# Patient Record
Sex: Male | Born: 1945
Health system: Southern US, Community
[De-identification: ages and names within clinical notes are randomized; demographics above are authoritative.]

## PROBLEM LIST (undated history)

## (undated) DIAGNOSIS — E079 Disorder of thyroid, unspecified: Secondary | ICD-10-CM

## (undated) DIAGNOSIS — D696 Thrombocytopenia, unspecified: Secondary | ICD-10-CM

## (undated) DIAGNOSIS — E039 Hypothyroidism, unspecified: Secondary | ICD-10-CM

## (undated) DIAGNOSIS — E785 Hyperlipidemia, unspecified: Secondary | ICD-10-CM

## (undated) DIAGNOSIS — E119 Type 2 diabetes mellitus without complications: Secondary | ICD-10-CM

## (undated) DIAGNOSIS — Z87442 Personal history of urinary calculi: Secondary | ICD-10-CM

## (undated) DIAGNOSIS — F0390 Unspecified dementia without behavioral disturbance: Secondary | ICD-10-CM

## (undated) DIAGNOSIS — I10 Essential (primary) hypertension: Secondary | ICD-10-CM

## (undated) DIAGNOSIS — G4733 Obstructive sleep apnea (adult) (pediatric): Secondary | ICD-10-CM

## (undated) HISTORY — PX: COLONOSCOPY: SHX174

## (undated) HISTORY — PX: BONE MARROW BIOPSY: SHX199

---

## 2004-05-06 ENCOUNTER — Ambulatory Visit: Payer: Self-pay | Admitting: Internal Medicine

## 2004-06-26 ENCOUNTER — Ambulatory Visit: Payer: Self-pay | Admitting: Internal Medicine

## 2005-07-05 ENCOUNTER — Ambulatory Visit: Payer: Self-pay | Admitting: Internal Medicine

## 2007-04-03 ENCOUNTER — Ambulatory Visit: Payer: Self-pay | Admitting: Internal Medicine

## 2007-06-04 ENCOUNTER — Ambulatory Visit: Payer: Self-pay | Admitting: Gastroenterology

## 2007-07-07 ENCOUNTER — Ambulatory Visit: Payer: Self-pay | Admitting: Internal Medicine

## 2007-07-10 ENCOUNTER — Ambulatory Visit: Payer: Self-pay | Admitting: Internal Medicine

## 2007-08-07 ENCOUNTER — Ambulatory Visit: Payer: Self-pay | Admitting: Internal Medicine

## 2007-09-07 ENCOUNTER — Ambulatory Visit: Payer: Self-pay | Admitting: Internal Medicine

## 2007-12-05 ENCOUNTER — Ambulatory Visit: Payer: Self-pay | Admitting: Internal Medicine

## 2008-01-05 ENCOUNTER — Ambulatory Visit: Payer: Self-pay | Admitting: Internal Medicine

## 2008-07-06 ENCOUNTER — Ambulatory Visit: Payer: Self-pay | Admitting: Internal Medicine

## 2008-08-06 ENCOUNTER — Ambulatory Visit: Payer: Self-pay | Admitting: Internal Medicine

## 2013-02-18 ENCOUNTER — Ambulatory Visit: Payer: Self-pay | Admitting: Gastroenterology

## 2018-02-20 ENCOUNTER — Other Ambulatory Visit: Payer: Self-pay

## 2018-02-20 ENCOUNTER — Emergency Department: Payer: Medicare PPO

## 2018-02-20 ENCOUNTER — Encounter: Payer: Self-pay | Admitting: Emergency Medicine

## 2018-02-20 ENCOUNTER — Emergency Department
Admission: EM | Admit: 2018-02-20 | Discharge: 2018-02-20 | Disposition: A | Discharge status: Home / Self Care | Payer: Medicare PPO | Attending: Emergency Medicine | Admitting: Emergency Medicine

## 2018-02-20 DIAGNOSIS — Y929 Unspecified place or not applicable: Secondary | ICD-10-CM | POA: Diagnosis not present

## 2018-02-20 DIAGNOSIS — S2241XA Multiple fractures of ribs, right side, initial encounter for closed fracture: Secondary | ICD-10-CM | POA: Insufficient documentation

## 2018-02-20 DIAGNOSIS — S299XXA Unspecified injury of thorax, initial encounter: Secondary | ICD-10-CM | POA: Diagnosis present

## 2018-02-20 DIAGNOSIS — E119 Type 2 diabetes mellitus without complications: Secondary | ICD-10-CM | POA: Diagnosis not present

## 2018-02-20 DIAGNOSIS — E039 Hypothyroidism, unspecified: Secondary | ICD-10-CM | POA: Diagnosis not present

## 2018-02-20 DIAGNOSIS — I10 Essential (primary) hypertension: Secondary | ICD-10-CM | POA: Diagnosis not present

## 2018-02-20 DIAGNOSIS — Y9389 Activity, other specified: Secondary | ICD-10-CM | POA: Diagnosis not present

## 2018-02-20 DIAGNOSIS — Y998 Other external cause status: Secondary | ICD-10-CM | POA: Insufficient documentation

## 2018-02-20 DIAGNOSIS — W108XXA Fall (on) (from) other stairs and steps, initial encounter: Secondary | ICD-10-CM | POA: Diagnosis not present

## 2018-02-20 HISTORY — DX: Type 2 diabetes mellitus without complications: E11.9

## 2018-02-20 HISTORY — DX: Thrombocytopenia, unspecified: D69.6

## 2018-02-20 HISTORY — DX: Disorder of thyroid, unspecified: E07.9

## 2018-02-20 HISTORY — DX: Obstructive sleep apnea (adult) (pediatric): G47.33

## 2018-02-20 HISTORY — DX: Hyperlipidemia, unspecified: E78.5

## 2018-02-20 HISTORY — DX: Essential (primary) hypertension: I10

## 2018-02-20 MED ORDER — OXYCODONE-ACETAMINOPHEN 5-325 MG PO TABS: 1.0000 | ORAL_TABLET | ORAL | 0 refills | Status: DC | PRN

## 2018-02-20 MED ORDER — BUPIVACAINE HCL (PF) 0.5 % IJ SOLN
INTRAMUSCULAR | Status: AC
  Administered 2018-02-20: 50 mL
  Filled 2018-02-20: qty 30

## 2018-02-20 MED ORDER — BUPIVACAINE HCL 0.5 % IJ SOLN
50.0000 mL | Freq: Once | INTRAMUSCULAR | Status: AC
  Administered 2018-02-20: 50 mL
  Filled 2018-02-20: qty 50

## 2018-02-20 MED ORDER — ONDANSETRON HCL 4 MG/2ML IJ SOLN
4.0000 mg | Freq: Once | INTRAMUSCULAR | Status: AC
  Administered 2018-02-20: 4 mg via INTRAVENOUS
  Filled 2018-02-20: qty 2

## 2018-02-20 MED ORDER — LIDOCAINE HCL (PF) 1 % IJ SOLN
5.0000 mL | Freq: Once | INTRAMUSCULAR | Status: AC
  Administered 2018-02-20: 5 mL
  Filled 2018-02-20: qty 5

## 2018-02-20 MED ORDER — MORPHINE SULFATE (PF) 4 MG/ML IV SOLN
4.0000 mg | Freq: Once | INTRAVENOUS | Status: AC
  Administered 2018-02-20: 4 mg via INTRAVENOUS
  Filled 2018-02-20: qty 1

## 2018-02-20 MED ORDER — OXYCODONE-ACETAMINOPHEN 5-325 MG PO TABS
2.0000 | ORAL_TABLET | Freq: Once | ORAL | Status: AC
  Administered 2018-02-20: 2 via ORAL
  Filled 2018-02-20: qty 2

## 2018-02-20 NOTE — ED Triage Notes (Signed)
Patient to ER for c/o pain to right sided ribs. Patient states on Sunday night he slipped on some towels that were on the stairs, rib cage on right side hit stair railing. States on Tuesday, area became a little more painful. Reports tonight "It feels like lightning going through it.".

## 2018-02-20 NOTE — ED Provider Notes (Addendum)
[facility] Emergency Department Provider Note ___________________________   First MD Initiated Contact with Patient 02/20/18 [phone]     (approximate)  I have reviewed the triage vital signs and the nursing notes.   HISTORY  Chief Complaint Rib Injury    HPI Tanner Oconnell is a 72 y.o. male with below list of chronic medical conditions presents to the emergency department with history of accidental slip and fall with resultant right lateral chest wall injury on Sunday night.  Patient admits to immediate pain in the areA.  Patient states tonight pain abruptly worsened when he rolled over in bed.  Patient states that the pain is worse with movement and deep inspiration.  Patient denies any fever no cough.  Past Medical History:  Diagnosis Date  . Diabetes mellitus without complication ([place])   . Hyperlipidemia   . Hypertension   . Obstructive sleep apnea   . Thrombocytopenia ([place])   . Thyroid disease    Hypothyroidism    There are no active problems to display for this patient.   Past Surgical History:  Procedure Laterality Date  . BONE MARROW BIOPSY      Prior to Admission medications   Medication Sig Start Date End Date Taking? Authorizing Provider  oxyCODONE-acetaminophen (PERCOCET) 5-325 MG tablet Take 1 tablet by mouth every 4 (four) hours as needed for severe pain. 02/20/18   Gregor Hams, MD    Allergies Penicillins  No family history on file.  Social History Social History   Tobacco Use  . Smoking status: Never Smoker  . Smokeless tobacco: Current User    Types: Chew  Substance Use Topics  . Alcohol use: Never    Frequency: Never  . Drug use: Never    Review of Systems Constitutional: No fever/chills Eyes: No visual changes. ENT: No sore throat. Cardiovascular: Positive for right lateral chest wall pain Respiratory: Denies shortness of breath. Gastrointestinal: No abdominal pain.  No nausea, no vomiting.  No  diarrhea.  No constipation. Genitourinary: Negative for dysuria. Musculoskeletal: Negative for neck pain.  Negative for back pain. Integumentary: Negative for rash. Neurological: Negative for headaches, focal weakness or numbness.   ____________________________________________   PHYSICAL EXAM:  VITAL SIGNS: ED Triage Vitals  Enc Vitals Group     BP 02/20/18 0304 (!) 159/73     Pulse Rate 02/20/18 0304 (!) 57     Resp 02/20/18 0304 20     Temp 02/20/18 0304 98.3 F (36.8 C)     Temp Source 02/20/18 0304 Oral     SpO2 02/20/18 0304 99 %     Weight 02/20/18 0305 115.7 kg (255 lb)     Height 02/20/18 0305 1.829 m (6')     Head Circumference --      Peak Flow --      Pain Score 02/20/18 0305 1     Pain Loc --      Pain Edu? --      Excl. in [place]? --     Constitutional: Alert and oriented.  Apparent discomfort  eyes: Conjunctivae are normal. Head: Atraumatic. Mouth/Throat: Mucous membranes are moist.  Oropharynx non-erythematous. Neck: No stridor.   Cardiovascular: Normal rate, regular rhythm. Good peripheral circulation. Grossly normal heart sounds. Chest: Pain to palpation of the right lateral chest wall area fourth fifth and sixth rib Respiratory: Normal respiratory effort.  No retractions. Lungs CTAB. Gastrointestinal: Soft and nontender. No distention.  Musculoskeletal: No lower extremity tenderness nor edema. No gross deformities  of extremities. Neurologic:  Normal speech and language. No gross focal neurologic deficits are appreciated.  Skin:  Skin is warm, dry and intact. No rash noted. Psychiatric: Mood and affect are normal. Speech and behavior are normal.   RADIOLOGY I, [place], personally viewed and evaluated these images (plain radiographs) as part of my medical decision making, as well as reviewing the written report by the radiologist.  ED MD interpretation: Fracture of the right fifth and sixth rib noted on chest x-ray.  Official radiology  report(s): Dg Ribs Unilateral W/chest Right  Result Date: 02/20/2018 CLINICAL DATA:  72 year old male with right-sided chest pain. EXAM: RIGHT RIBS AND CHEST - 3+ VIEW COMPARISON:  Chest CT dated 04/03/2007 FINDINGS: Minimal bibasilar atelectatic changes. No focal consolidation, pleural effusion, or pneumothorax. Mild cardiomegaly. Fractures of the lateral right fifth and sixth ribs. Degenerative changes of the spine. IMPRESSION: Right-sided rib fractures.  No pneumothorax. Electronically Signed   By: Anner Crete M.D.   On: 02/20/2018 03:44     Procedures   ____________________________________________   INITIAL IMPRESSION / ASSESSMENT AND PLAN / ED COURSE  As part of my medical decision making, I reviewed the following data within the electronic MEDICAL RECORD NUMBER   72 year old male presenting with history and physical exam consistent with right rib fracture which was confirmed on rib x-ray.  No evidence of pneumothorax or pulmonary contusion.  Patient given IV morphine in the emergency department with pain improvement.  Spoke with the patient and his wife about management of rib fractures and the necessity of controlling his pain.  Both the patient and his wife were advised of warning signs that would warrant immediate return to the emergency department.  Patient will be prescribed Percocet for home ____________________________________________  FINAL CLINICAL IMPRESSION(S) / ED DIAGNOSES  Final diagnoses:  Closed fracture of multiple ribs of right side, initial encounter     MEDICATIONS GIVEN DURING THIS VISIT:  Medications  oxyCODONE-acetaminophen (PERCOCET/ROXICET) 5-325 MG per tablet 2 tablet (has no administration in time range)  ondansetron (ZOFRAN) injection 4 mg (4 mg Intravenous Given 02/20/18 0425)  morphine 4 MG/ML injection 4 mg (4 mg Intravenous Given 02/20/18 0425)  bupivacaine (MARCAINE) 0.5 % (with pres) injection 50 mL (50 mLs Infiltration Given 02/20/18 0527)    lidocaine (PF) (XYLOCAINE) 1 % injection 5 mL (5 mLs Other Given 02/20/18 9528)     ED Discharge Orders        Ordered    oxyCODONE-acetaminophen (PERCOCET) 5-325 MG tablet  Every 4 hours PRN     02/20/18 0525       Note:  This document was prepared using Dragon voice recognition software and may include unintentional dictation errors.    Gregor Hams, MD 02/20/18 0542    Gregor Hams, MD 02/20/18 [phone]

## 2018-03-27 ENCOUNTER — Ambulatory Visit
Admission: EM | Admit: 2018-03-27 | Discharge: 2018-03-27 | Discharge status: Against Medical Advice | Payer: Medicare PPO | Source: Home / Self Care | Attending: Family Medicine | Admitting: Family Medicine

## 2018-03-27 ENCOUNTER — Encounter: Payer: Self-pay | Admitting: Emergency Medicine

## 2018-03-27 ENCOUNTER — Emergency Department
Admission: EM | Admit: 2018-03-27 | Discharge: 2018-03-27 | Disposition: A | Discharge status: Home / Self Care | Payer: Medicare PPO | Attending: Emergency Medicine | Admitting: Emergency Medicine

## 2018-03-27 ENCOUNTER — Other Ambulatory Visit: Payer: Self-pay

## 2018-03-27 DIAGNOSIS — K0889 Other specified disorders of teeth and supporting structures: Secondary | ICD-10-CM | POA: Diagnosis present

## 2018-03-27 DIAGNOSIS — E119 Type 2 diabetes mellitus without complications: Secondary | ICD-10-CM | POA: Diagnosis not present

## 2018-03-27 DIAGNOSIS — F17220 Nicotine dependence, chewing tobacco, uncomplicated: Secondary | ICD-10-CM | POA: Diagnosis not present

## 2018-03-27 DIAGNOSIS — Z79899 Other long term (current) drug therapy: Secondary | ICD-10-CM | POA: Insufficient documentation

## 2018-03-27 DIAGNOSIS — I10 Essential (primary) hypertension: Secondary | ICD-10-CM | POA: Diagnosis not present

## 2018-03-27 DIAGNOSIS — E039 Hypothyroidism, unspecified: Secondary | ICD-10-CM | POA: Diagnosis not present

## 2018-03-27 DIAGNOSIS — K047 Periapical abscess without sinus: Secondary | ICD-10-CM | POA: Insufficient documentation

## 2018-03-27 LAB — BASIC METABOLIC PANEL
ANION GAP: 7 (ref 5–15)
BUN: 20 mg/dL (ref 8–23)
CALCIUM: 9.8 mg/dL (ref 8.9–10.3)
CO2: 30 mmol/L (ref 22–32)
CREATININE: 1 mg/dL (ref 0.61–1.24)
Chloride: 101 mmol/L (ref 98–111)
GFR calc non Af Amer: >60 mL/min (ref >60)
Glucose, Bld: 140 mg/dL — ABNORMAL HIGH (ref 70–99)
Potassium: 4.2 mmol/L (ref 3.5–5.1)
Sodium: 138 mmol/L (ref 135–145)

## 2018-03-27 LAB — CBC
HEMATOCRIT: 41.3 % (ref 40.0–52.0)
HEMOGLOBIN: 14.6 g/dL (ref 13.0–18.0)
MCH: 30.7 pg (ref 26.0–34.0)
MCHC: 35.3 g/dL (ref 32.0–36.0)
MCV: 86.9 fL (ref 80.0–100.0)
Platelets: 112 10*3/uL — ABNORMAL LOW (ref 150–440)
RBC: 4.76 MIL/uL (ref 4.40–5.90)
RDW: 14.4 % (ref 11.5–14.5)
WBC: 8 10*3/uL (ref 3.8–10.6)

## 2018-03-27 MED ORDER — OXYCODONE-ACETAMINOPHEN 5-325 MG PO TABS
1.0000 | ORAL_TABLET | Freq: Once | ORAL | Status: AC
  Administered 2018-03-27: 1 via ORAL
  Filled 2018-03-27: qty 1

## 2018-03-27 MED ORDER — CLINDAMYCIN PHOSPHATE 600 MG/50ML IV SOLN
600.0000 mg | Freq: Once | INTRAVENOUS | Status: AC
  Administered 2018-03-27: 600 mg via INTRAVENOUS
  Filled 2018-03-27: qty 50

## 2018-03-27 MED ORDER — LEVOFLOXACIN IN D5W 750 MG/150ML IV SOLN
750.0000 mg | Freq: Once | INTRAVENOUS | Status: AC
  Administered 2018-03-27: 750 mg via INTRAVENOUS
  Filled 2018-03-27: qty 150

## 2018-03-27 MED ORDER — LIDOCAINE VISCOUS HCL 2 % MT SOLN
15.0000 mL | Freq: Once | OROMUCOSAL | Status: AC
  Administered 2018-03-27: 15 mL via OROMUCOSAL
  Filled 2018-03-27: qty 15

## 2018-03-27 MED ORDER — LIDOCAINE VISCOUS HCL 2 % MT SOLN: 10.0000 mL | OROMUCOSAL | 0 refills | Status: DC | PRN

## 2018-03-27 MED ORDER — ACETAMINOPHEN 325 MG PO TABS
650.0000 mg | ORAL_TABLET | Freq: Once | ORAL | Status: AC
  Administered 2018-03-27: 650 mg via ORAL

## 2018-03-27 MED ORDER — ACETAMINOPHEN 325 MG PO TABS
ORAL_TABLET | ORAL | Status: AC
  Administered 2018-03-27: 650 mg via ORAL
  Filled 2018-03-27: qty 2

## 2018-03-27 MED ORDER — CLINDAMYCIN HCL 300 MG PO CAPS: 300.0000 mg | ORAL_CAPSULE | Freq: Three times a day (TID) | ORAL | 0 refills | Status: AC

## 2018-03-27 MED ORDER — SODIUM CHLORIDE 0.9 % IV BOLUS: 1000.0000 mL | Freq: Once | INTRAVENOUS | Status: DC

## 2018-03-27 NOTE — ED Notes (Signed)
Pt observed to have swelling to left jaw , (reported to have started last night ) Pt reports he has DDS appointment tomorrow ,was at urgent care today and sent here for pain management and IV antibiotics Pt requesting pain meds at this time .

## 2018-03-27 NOTE — ED Provider Notes (Signed)
[facility] Emergency Department Provider Note  ____________________________________________  Time seen: Approximately 8:52 PM  I have reviewed the triage vital signs and the nursing notes.   HISTORY  Chief Complaint Dental Pain    HPI Tanner Oconnell is a 72 y.o. male presents emergency department for evaluation of dental pain and left cheek swelling for 1 day.  He is having pain over his top left back molar.  Patient states that he went to urgent care but while he was waiting for urgent care he talked to his friend that is a Pharmacist, community and was told that he should come to the emergency department for IV antibiotics.  He has an appointment with an oral surgeon tomorrow at noon.  He has felt warm but has not checked his temperature.  Patient is allergic to penicillin and has not had it as a child and is not sure what his reaction was.  He is not having any difficulty breathing or swallowing.  Past Medical History:  Diagnosis Date  . Diabetes mellitus without complication ([place])   . Hyperlipidemia   . Hypertension   . Obstructive sleep apnea   . Thrombocytopenia ([place])   . Thyroid disease    Hypothyroidism    There are no active problems to display for this patient.   Past Surgical History:  Procedure Laterality Date  . BONE MARROW BIOPSY      Prior to Admission medications   Medication Sig Start Date End Date Taking? Authorizing Provider  clindamycin (CLEOCIN) 300 MG capsule Take 1 capsule (300 mg total) by mouth 3 (three) times daily for 10 days. 03/27/18 04/06/18  Laban Emperor, PA-C  lidocaine (XYLOCAINE) 2 % solution Use as directed 10 mLs in the mouth or throat as needed for mouth pain. 03/27/18   Laban Emperor, PA-C  oxyCODONE-acetaminophen (PERCOCET) 5-325 MG tablet Take 1 tablet by mouth every 4 (four) hours as needed for severe pain. 02/20/18   Gregor Hams, MD    Allergies Penicillins  No family history on file.  Social History Social  History   Tobacco Use  . Smoking status: Never Smoker  . Smokeless tobacco: Current User    Types: Chew  Substance Use Topics  . Alcohol use: Never    Frequency: Never  . Drug use: Never     Review of Systems  Constitutional: Positive for feeling warm. Respiratory:  No SOB. Gastrointestinal: No nausea, no vomiting.  Musculoskeletal: Negative for musculoskeletal pain. Skin: Negative for rash, abrasions, lacerations, ecchymosis.   ____________________________________________   PHYSICAL EXAM:  VITAL SIGNS: ED Triage Vitals [03/27/18 1817]  Enc Vitals Group     BP (!) 173/84     Pulse Rate 91     Resp 16     Temp 99.5 F (37.5 C)     Temp Source Oral     SpO2 96 %     Weight 255 lb (115.7 kg)     Height 6' (1.829 m)     Head Circumference      Peak Flow      Pain Score 9     Pain Loc      Pain Edu?      Excl. in [place]?      Constitutional: Alert and oriented. Well appearing and in no acute distress. Eyes: Conjunctivae are normal. PERRL. EOMI. Head: Atraumatic. ENT:      Ears:      Nose: No congestion/rhinnorhea.      Mouth/Throat: Mucous membranes are  moist.  Swelling to left cheek.  Tenderness to palpation over tooth #15.  Minimal yellow purulent drainage lateral to back molar.  No submandibular swelling. No trismus.  Neck: No stridor.   Cardiovascular: Normal rate, regular rhythm.  Good peripheral circulation. Respiratory: Normal respiratory effort without tachypnea or retractions. Lungs CTAB. Good air entry to the bases with no decreased or absent breath sounds. Musculoskeletal: Full range of motion to all extremities. No gross deformities appreciated. Neurologic:  Normal speech and language. No gross focal neurologic deficits are appreciated.  Skin:  Skin is warm, dry and intact. No rash noted. Psychiatric: Mood and affect are normal. Speech and behavior are normal. Patient exhibits appropriate insight and  judgement.   ____________________________________________   LABS (all labs ordered are listed, but only abnormal results are displayed)  Labs Reviewed  CBC - Abnormal; Notable for the following components:      Result Value   Platelets 112 (*)    All other components within normal limits  BASIC METABOLIC PANEL - Abnormal; Notable for the following components:   Glucose, Bld 140 (*)    All other components within normal limits   ____________________________________________  EKG   ____________________________________________  RADIOLOGY   No results found.  ____________________________________________    PROCEDURES  Procedure(s) performed:    Procedures    Medications  sodium chloride 0.9 % bolus 1,000 mL (has no administration in time range)  clindamycin (CLEOCIN) IVPB 600 mg (0 mg Intravenous Stopped 03/27/18 2117)  levofloxacin (LEVAQUIN) IVPB 750 mg (0 mg Intravenous Stopped 03/27/18 2219)  oxyCODONE-acetaminophen (PERCOCET/ROXICET) 5-325 MG per tablet 1 tablet (1 tablet Oral Given 03/27/18 2042)  lidocaine (XYLOCAINE) 2 % viscous mouth solution 15 mL (15 mLs Mouth/Throat Given 03/27/18 2042)  acetaminophen (TYLENOL) tablet 650 mg (650 mg Oral Given 03/27/18 2235)     ____________________________________________   INITIAL IMPRESSION / ASSESSMENT AND PLAN / ED COURSE  Pertinent labs & imaging results that were available during my care of the patient were reviewed by me and considered in my medical decision making (see chart for details).  Review of the [place] CSRS was performed in accordance of the [place] prior to dispensing any controlled drugs.   Patient's diagnosis is consistent with dental abscess.  Vital signs and exam are reassuring.  WBC within normal limits.   Patient appears well and is very talkative.  He is not having any difficulty breathing or swallowing. He is drinking cups of ice water. Patient is allergic to amoxicillin and penicillin.  He was given  IV clindamycin and Levaquin in ED. Pain improved with viscous lidocaine.  Patient will be discharged home with prescriptions for clindamycin.  He feels well and is ready to go home.  Patient is to follow up with dentist as directed.  He has an appointment with oral surgery tomorrow at noon.  Patient is given ED precautions to return to the ED for any worsening or new symptoms.     ____________________________________________  FINAL CLINICAL IMPRESSION(S) / ED DIAGNOSES  Final diagnoses:  Dental abscess      NEW MEDICATIONS STARTED DURING THIS VISIT:  ED Discharge Orders         Ordered    clindamycin (CLEOCIN) 300 MG capsule  3 times daily     03/27/18 2126    lidocaine (XYLOCAINE) 2 % solution  As needed     03/27/18 2126              This chart was dictated using voice recognition  software/Dragon. Despite best efforts to proofread, errors can occur which can change the meaning. Any change was purely unintentional.    Laban Emperor, PA-C 03/27/18 2357    Arta Silence, MD 04/13/18 [phone]

## 2018-03-27 NOTE — ED Provider Notes (Signed)
Patient left before being seen. Went to ED.  [facility]    [place], [place] 03/27/18 1756

## 2018-03-27 NOTE — Discharge Instructions (Addendum)
Follow up with your oral surgery appointment tomorrow.

## 2018-03-27 NOTE — ED Triage Notes (Signed)
Pt to ED via POV with c/o dental pain, pt states after he ate dinner last night he possible has something stuck in gum, + swelling noted. NAD noted

## 2018-09-29 ENCOUNTER — Other Ambulatory Visit: Payer: Self-pay | Admitting: Neurology

## 2018-09-29 DIAGNOSIS — R413 Other amnesia: Secondary | ICD-10-CM

## 2018-10-07 ENCOUNTER — Ambulatory Visit
Admission: RE | Admit: 2018-10-07 | Discharge: 2018-10-07 | Disposition: A | Discharge status: Home / Self Care | Payer: Medicare HMO | Source: Ambulatory Visit | Attending: Neurology | Admitting: Neurology

## 2018-10-07 ENCOUNTER — Other Ambulatory Visit: Payer: Self-pay

## 2018-10-07 DIAGNOSIS — R413 Other amnesia: Secondary | ICD-10-CM

## 2018-11-10 ENCOUNTER — Other Ambulatory Visit (HOSPITAL_COMMUNITY): Payer: Self-pay | Admitting: Gastroenterology

## 2018-11-10 ENCOUNTER — Other Ambulatory Visit: Payer: Self-pay | Admitting: Gastroenterology

## 2018-11-10 DIAGNOSIS — R17 Unspecified jaundice: Secondary | ICD-10-CM

## 2018-11-10 DIAGNOSIS — D696 Thrombocytopenia, unspecified: Secondary | ICD-10-CM

## 2018-11-24 ENCOUNTER — Other Ambulatory Visit: Payer: Self-pay

## 2018-11-24 ENCOUNTER — Ambulatory Visit
Admission: RE | Admit: 2018-11-24 | Discharge: 2018-11-24 | Disposition: A | Discharge status: Home / Self Care | Payer: Medicare HMO | Source: Ambulatory Visit | Attending: Gastroenterology | Admitting: Gastroenterology

## 2018-11-24 DIAGNOSIS — D696 Thrombocytopenia, unspecified: Secondary | ICD-10-CM | POA: Diagnosis not present

## 2018-11-24 DIAGNOSIS — R17 Unspecified jaundice: Secondary | ICD-10-CM

## 2019-01-16 ENCOUNTER — Ambulatory Visit
Admission: RE | Admit: 2019-01-16 | Discharge: 2019-01-16 | Disposition: A | Discharge status: Home / Self Care | Payer: Medicare HMO | Attending: Internal Medicine | Admitting: Internal Medicine

## 2019-01-16 ENCOUNTER — Other Ambulatory Visit: Payer: Self-pay | Admitting: Internal Medicine

## 2019-01-16 ENCOUNTER — Other Ambulatory Visit: Payer: Self-pay

## 2019-01-16 ENCOUNTER — Ambulatory Visit
Admission: RE | Admit: 2019-01-16 | Discharge: 2019-01-16 | Disposition: A | Discharge status: Home / Self Care | Payer: Medicare HMO | Source: Ambulatory Visit | Attending: Internal Medicine | Admitting: Internal Medicine

## 2019-01-16 DIAGNOSIS — M545 Low back pain, unspecified: Secondary | ICD-10-CM

## 2019-02-04 ENCOUNTER — Other Ambulatory Visit: Payer: Self-pay | Admitting: Internal Medicine

## 2019-02-04 DIAGNOSIS — M545 Low back pain, unspecified: Secondary | ICD-10-CM

## 2019-02-18 ENCOUNTER — Ambulatory Visit: Payer: Medicare HMO

## 2019-03-08 ENCOUNTER — Ambulatory Visit: Payer: Medicare HMO

## 2019-10-06 ENCOUNTER — Other Ambulatory Visit: Payer: Self-pay

## 2019-10-06 ENCOUNTER — Ambulatory Visit (INDEPENDENT_AMBULATORY_CARE_PROVIDER_SITE_OTHER)
Admit: 2019-10-06 | Discharge: 2019-10-06 | Disposition: A | Discharge status: Home / Self Care | Payer: Medicare HMO | Attending: Family Medicine | Admitting: Family Medicine

## 2019-10-06 ENCOUNTER — Ambulatory Visit
Admission: EM | Admit: 2019-10-06 | Discharge: 2019-10-06 | Disposition: A | Discharge status: Home / Self Care | Payer: Medicare HMO | Attending: Family Medicine | Admitting: Family Medicine

## 2019-10-06 ENCOUNTER — Encounter: Payer: Self-pay | Admitting: Emergency Medicine

## 2019-10-06 DIAGNOSIS — N1330 Unspecified hydronephrosis: Secondary | ICD-10-CM | POA: Diagnosis present

## 2019-10-06 DIAGNOSIS — R1031 Right lower quadrant pain: Secondary | ICD-10-CM

## 2019-10-06 DIAGNOSIS — M545 Low back pain: Secondary | ICD-10-CM | POA: Diagnosis not present

## 2019-10-06 DIAGNOSIS — N201 Calculus of ureter: Secondary | ICD-10-CM | POA: Insufficient documentation

## 2019-10-06 HISTORY — DX: Unspecified dementia, unspecified severity, without behavioral disturbance, psychotic disturbance, mood disturbance, and anxiety: F03.90

## 2019-10-06 LAB — COMPREHENSIVE METABOLIC PANEL
ALT: 18 U/L (ref 0–44)
AST: 26 U/L (ref 15–41)
Albumin: 4.9 g/dL (ref 3.5–5.0)
Alkaline Phosphatase: 46 U/L (ref 38–126)
Anion gap: 11 (ref 5–15)
BUN: 30 mg/dL — ABNORMAL HIGH (ref 8–23)
CO2: 21 mmol/L — ABNORMAL LOW (ref 22–32)
Calcium: 9.5 mg/dL (ref 8.9–10.3)
Chloride: 100 mmol/L (ref 98–111)
Creatinine, Ser: 1.87 mg/dL — ABNORMAL HIGH (ref 0.61–1.24)
GFR calc Af Amer: 40 mL/min — ABNORMAL LOW (ref >60)
GFR calc non Af Amer: 34 mL/min — ABNORMAL LOW (ref >60)
Glucose, Bld: 141 mg/dL — ABNORMAL HIGH (ref 70–99)
Potassium: 3.9 mmol/L (ref 3.5–5.1)
Sodium: 132 mmol/L — ABNORMAL LOW (ref 135–145)
Total Bilirubin: 1.5 mg/dL — ABNORMAL HIGH (ref 0.3–1.2)
Total Protein: 8.7 g/dL — ABNORMAL HIGH (ref 6.5–8.1)

## 2019-10-06 LAB — CBC WITH DIFFERENTIAL/PLATELET
Abs Immature Granulocytes: 0.03 10*3/uL (ref 0.00–0.07)
Basophils Absolute: 0 10*3/uL (ref 0.0–0.1)
Basophils Relative: 0 %
Eosinophils Absolute: 0.1 10*3/uL (ref 0.0–0.5)
Eosinophils Relative: 1 %
HCT: 36.5 % — ABNORMAL LOW (ref 39.0–52.0)
Hemoglobin: 12.4 g/dL — ABNORMAL LOW (ref 13.0–17.0)
Immature Granulocytes: 0 %
Lymphocytes Relative: 11 %
Lymphs Abs: 0.8 10*3/uL (ref 0.7–4.0)
MCH: 28.9 pg (ref 26.0–34.0)
MCHC: 34 g/dL (ref 30.0–36.0)
MCV: 85.1 fL (ref 80.0–100.0)
Monocytes Absolute: 0.7 10*3/uL (ref 0.1–1.0)
Monocytes Relative: 10 %
Neutro Abs: 5.3 10*3/uL (ref 1.7–7.7)
Neutrophils Relative %: 78 %
Platelets: 139 10*3/uL — ABNORMAL LOW (ref 150–400)
RBC: 4.29 MIL/uL (ref 4.22–5.81)
RDW: 14 % (ref 11.5–15.5)
WBC: 6.9 10*3/uL (ref 4.0–10.5)
nRBC: 0 % (ref 0.0–0.2)

## 2019-10-06 LAB — LIPASE, BLOOD: Lipase: 21 U/L (ref 11–51)

## 2019-10-06 MED ORDER — HYDROCODONE-ACETAMINOPHEN 5-325 MG PO TABS: 1.0000 | ORAL_TABLET | Freq: Three times a day (TID) | ORAL | 0 refills | Status: DC | PRN

## 2019-10-06 MED ORDER — TAMSULOSIN HCL 0.4 MG PO CAPS: 0.4000 mg | ORAL_CAPSULE | Freq: Every day | ORAL | 0 refills | Status: DC

## 2019-10-06 MED ORDER — DOCUSATE SODIUM 100 MG PO CAPS: 100.0000 mg | ORAL_CAPSULE | Freq: Two times a day (BID) | ORAL | 0 refills | Status: DC

## 2019-10-06 NOTE — ED Triage Notes (Signed)
Patient in today with his wife who states patient has dementia. Wife states patient has been c/o abdominal pain (right sided) and right sided back pain x 1 day. Patient states he feels like his abdomen is swelling and feels like he needs to empty it. Not eating. Patient took milk of magnesia on 10/03/19 and again on 10/05/19. Patient has a bowel movement yesterday that was runny and not formed.

## 2019-10-06 NOTE — Discharge Instructions (Signed)
Hold his metformin and HCTZ-Losartan for 1-2 days.  Medication as prescribed.  If he worsens, go to the ER.  Take care  Dr. Lacinda Axon

## 2019-10-06 NOTE — ED Triage Notes (Signed)
Called [employer] and spoke with Franchot Heidelberg for prior auth of CT abd/pelvis without contrast. CT has been approved (no auth# given).

## 2019-10-06 NOTE — ED Provider Notes (Addendum)
[facility]    CSN: [id number] Arrival date & time: 10/06/19  1325      History   Chief Complaint Chief Complaint  Patient presents with  . Abdominal Pain  . Back Pain   HPI   74 year old male presents for evaluation of the above.  History obtained primarily from the wife as patient has memory loss which is thought to be secondary to underlying Alzheimer's dementia per neurology.  Wife reports that he has had ongoing abdominal pain for the past 2 days.  Worse last night.  Was severe last night.  She wanted to call EMS to have him taken to the hospital and he refused.  Pain is on the right side of the abdomen/flank.  Patient states that he feels like he is bloated and full in the upper abdomen.  He has not eaten today.  He has taken milk of magnesia without resolution.  He had a bowel movement yesterday.  Denies urinary symptoms.  Abdominal pain 8/10 in severity on triage.  No relieving factors.  Wife reports that he has been complaining of right-sided back pain as well.  No other associated symptoms.  No other complaints or concerns at this time.  Past Medical History:  Diagnosis Date  . Dementia ([place])   . Diabetes mellitus without complication ([place])   . Hyperlipidemia   . Hypertension   . Obstructive sleep apnea   . Thrombocytopenia ([place])   . Thyroid disease    Hypothyroidism   Surgical Hx: COLONOSCOPY 02/18/2013  Dr. Mamie Nick. Oh @ [place] - Diverticulosis, [place] BONE MARROW 08/07/2003 - 08/05/2004  okay   COLONOSCOPY 08/06/2002 - 08/06/2003  Adenomatous Polyps   COLONOSCOPY 06/04/2007  Dr. Ivor Messier @ [facility]    Prior to Admission medications   Medication Sig Start Date End Date Taking? Authorizing Provider  amLODipine (NORVASC) 5 MG tablet TAKE 1 TABLET ONCE DAILY 06/19/19  Yes [provider]  aspirin 81 MG EC tablet Take by mouth.   Yes [provider]  CALCIUM-VITAMIN D PO Take by mouth.   Yes  [provider]  donepezil (ARICEPT) 10 MG tablet Take 10 mg by mouth daily. 09/01/19  Yes [provider]  fenofibrate (TRICOR) 145 MG tablet Take 1 tablet by mouth daily. 06/25/19 06/24/20 Yes [provider]  ferrous sulfate 325 (65 FE) MG tablet Take by mouth.   Yes [provider]  gabapentin (NEURONTIN) 100 MG capsule Take 1 capsule by mouth 2 (two) times daily.   Yes [provider]  glimepiride (AMARYL) 1 MG tablet Take 1 mg by mouth daily. 09/01/19  Yes [provider]  levothyroxine (SYNTHROID) 25 MCG tablet Take 25 mcg by mouth daily. 09/01/19  Yes [provider]  losartan-hydrochlorothiazide (HYZAAR) 100-25 MG tablet Take 1 tablet by mouth daily. 09/01/19  Yes [provider]  memantine (NAMENDA) 10 MG tablet Take 10 mg by mouth 2 (two) times daily. 09/15/19  Yes [provider]  metFORMIN (GLUCOPHAGE) 1000 MG tablet Take 1,000 mg by mouth 2 (two) times daily. 09/01/19  Yes [provider]  Multiple Vitamin (MULTI-VITAMIN) tablet Take by mouth.   Yes [provider]  Omega-3 Fatty Acids (FISH OIL) 1000 MG CAPS Take by mouth.   Yes [provider]  propranolol (INDERAL) 10 MG tablet Take 10 mg by mouth 2 (two) times daily. 08/31/19  Yes [provider]  sertraline (ZOLOFT) 100 MG tablet  Take 100 mg by mouth daily. 09/07/19  Yes [provider]  vitamin B-12 (CYANOCOBALAMIN) 1000 MCG tablet Take 1,000 mcg by mouth daily.   Yes [provider]  docusate sodium (COLACE) 100 MG capsule Take 1 capsule (100 mg total) by mouth 2 (two) times daily. For constipation. 10/06/19   Coral Spikes, DO  HYDROcodone-acetaminophen (NORCO/VICODIN) 5-325 MG tablet Take 1 tablet by mouth every 8 (eight) hours as needed. 10/06/19   Coral Spikes, DO  tamsulosin (FLOMAX) 0.4 MG CAPS capsule Take 1 capsule (0.4 mg total) by mouth daily. 10/06/19   Coral Spikes, DO    Family History Family  History  Problem Relation Age of Onset  . Cancer Mother   . Cancer Father     Social History Social History   Tobacco Use  . Smoking status: Never Smoker  . Smokeless tobacco: Former Systems developer    Types: Chew  Substance Use Topics  . Alcohol use: Never  . Drug use: Never     Allergies   Penicillins   Review of Systems Review of Systems  Constitutional: Negative.   Gastrointestinal: Positive for abdominal distention and abdominal pain.  Genitourinary: Positive for flank pain.  Musculoskeletal: Positive for back pain.   Physical Exam Triage Vital Signs ED Triage Vitals  Enc Vitals Group     BP 10/06/19 1403 (!) 137/105     Pulse Rate 10/06/19 1403 62     Resp 10/06/19 1403 18     Temp 10/06/19 1403 98.2 F (36.8 C)     Temp Source 10/06/19 1403 Oral     SpO2 10/06/19 1403 97 %     Weight 10/06/19 1403 242 lb (109.8 kg)     Height 10/06/19 1403 6' (1.829 m)     Head Circumference --      Peak Flow --      Pain Score 10/06/19 1402 8     Pain Loc --      Pain Edu? --      Excl. in [place]? --    Updated Vital Signs BP (!) 183/85 (BP Location: Right Arm) Comment: patient did not take HTN meds last night  Pulse 62   Temp 98.2 F (36.8 C) (Oral)   Resp 18   Ht 6' (1.829 m)   Wt 109.8 kg   SpO2 97%   BMI 32.82 kg/m   Visual Acuity Right Eye Distance:   Left Eye Distance:   Bilateral Distance:    Right Eye Near:   Left Eye Near:    Bilateral Near:     Physical Exam Vitals and nursing note reviewed.  Constitutional:      General: He is not in acute distress.    Appearance: Normal appearance. He is not ill-appearing.  HENT:     Head: Normocephalic and atraumatic.  Eyes:     General:        Right eye: No discharge.        Left eye: No discharge.     Conjunctiva/sclera: Conjunctivae normal.  Cardiovascular:     Rate and Rhythm: Normal rate and regular rhythm.     Heart sounds: No murmur.  Pulmonary:     Effort: Pulmonary effort is normal.     Breath  sounds: Normal breath sounds. No wheezing, rhonchi or rales.  Abdominal:     Comments: Abdomen slightly firm.  Right flank tender to palpation. No rebound or guarding.  Skin:    General: Skin is warm.  Findings: No rash.  Neurological:     Mental Status: He is alert.    UC Treatments / Results  Labs (all labs ordered are listed, but only abnormal results are displayed) Labs Reviewed  CBC WITH DIFFERENTIAL/PLATELET - Abnormal; Notable for the following components:      Result Value   Hemoglobin 12.4 (*)    HCT 36.5 (*)    Platelets 139 (*)    All other components within normal limits  COMPREHENSIVE METABOLIC PANEL - Abnormal; Notable for the following components:   Sodium 132 (*)    CO2 21 (*)    Glucose, Bld 141 (*)    BUN 30 (*)    Creatinine, Ser 1.87 (*)    Total Protein 8.7 (*)    Total Bilirubin 1.5 (*)    GFR calc non Af Amer 34 (*)    GFR calc Af Amer 40 (*)    All other components within normal limits  LIPASE, BLOOD    EKG   Radiology CT ABDOMEN PELVIS WO CONTRAST  Result Date: 10/06/2019 CLINICAL DATA:  Right side abdominal right back pain for 1 day. No known injury. EXAM: CT ABDOMEN AND PELVIS WITHOUT CONTRAST TECHNIQUE: Multidetector CT imaging of the abdomen and pelvis was performed following the standard protocol without IV contrast. COMPARISON:  None. FINDINGS: Lower chest: Mild dependent atelectasis is seen. No pleural or pericardial effusion. Calcific aortic and coronary atherosclerosis. Hepatobiliary: Gallbladder is unremarkable. There is fatty infiltration of the liver. Biliary tree appears normal. Pancreas: Unremarkable. No pancreatic ductal dilatation or surrounding inflammatory changes. Spleen: Normal in size. Calcification of the medial capsular surface is likely due to some prior infection or trauma. Adrenals/Urinary Tract: The adrenal glands appear normal. There is mild right hydronephrosis with some stranding about the right kidney and ureter due to  a punctate stone just proximal to the right ureterovesical junction. No other urinary tract stones are identified. A few small bilateral renal cysts are seen. Urinary bladder is decompressed but otherwise unremarkable. Stomach/Bowel: Stomach is within normal limits. Appendix appears normal. No evidence of bowel wall thickening, distention, or inflammatory changes. Vascular/Lymphatic: Aortic atherosclerosis. No enlarged abdominal or pelvic lymph nodes. Reproductive: Mild prostatomegaly. Other: Small fat containing inguinal hernia is noted. The patient also has a very small fat containing umbilical hernia. Musculoskeletal: No fracture or worrisome lesion. Convex left lumbar scoliosis and scattered degenerative change noted. IMPRESSION: Mild right hydronephrosis due to a punctate stone just proximal to the right UVJ. No other acute abnormality. No other urinary tract stones. Fatty infiltration of the liver. Calcific aortic and coronary atherosclerosis. Mild prostatomegaly. Small fat containing umbilical and bilateral inguinal hernias. Electronically Signed   By: Inge Rise M.D.   On: 10/06/2019 15:28    Procedures Procedures (including critical care time)  Medications Ordered in UC Medications - No data to display  Initial Impression / Assessment and Plan / UC Course  I have reviewed the triage vital signs and the nursing notes.  Pertinent labs & imaging results that were available during my care of the patient were reviewed by me and considered in my medical decision making (see chart for details).    74 year old male presents with flank pain and abdominal pain.  Laboratory studies notable for elevated creatinine of 1.87.  This is higher than his baseline of approximately 1.1.  No leukocytosis.  CT abdomen and pelvis was obtained.  I independently reviewed the CT scan.  Interpretation: Small stone noted at the right UVJ.   Treating  with Flomax, pain medication.  Push fluids.  Strain urine.   Stool softener to prevent constipation.  Supportive care.  [place] controlled substance database reviewed.  No concerns for abuse or misuse.  Final Clinical Impressions(s) / UC Diagnoses   Final diagnoses:  Ureterolithiasis  Hydronephrosis, unspecified hydronephrosis type     Discharge Instructions     Hold his metformin and HCTZ-Losartan for 1-2 days.  Medication as prescribed.  If he worsens, go to the ER.  Take care  Dr. Lacinda Axon     ED Prescriptions    Medication Sig Dispense Auth. Provider   HYDROcodone-acetaminophen (NORCO/VICODIN) 5-325 MG tablet Take 1 tablet by mouth every 8 (eight) hours as needed. 10 tablet Ardis Fullwood G, DO   tamsulosin (FLOMAX) 0.4 MG CAPS capsule Take 1 capsule (0.4 mg total) by mouth daily. 14 capsule Lailana Shira G, DO   docusate sodium (COLACE) 100 MG capsule Take 1 capsule (100 mg total) by mouth 2 (two) times daily. For constipation. 30 capsule Thersa Salt G, DO     I have reviewed the PDMP during this encounter.     Coral Spikes, [place] 10/06/19 1753

## 2020-02-22 ENCOUNTER — Other Ambulatory Visit
Admission: RE | Admit: 2020-02-22 | Discharge: 2020-02-22 | Disposition: A | Discharge status: Home / Self Care | Payer: Medicare HMO | Source: Ambulatory Visit | Attending: General Surgery | Admitting: General Surgery

## 2020-02-22 ENCOUNTER — Other Ambulatory Visit: Payer: Self-pay

## 2020-02-22 DIAGNOSIS — Z20822 Contact with and (suspected) exposure to covid-19: Secondary | ICD-10-CM | POA: Diagnosis not present

## 2020-02-22 DIAGNOSIS — Z01812 Encounter for preprocedural laboratory examination: Secondary | ICD-10-CM | POA: Diagnosis present

## 2020-02-22 LAB — SARS CORONAVIRUS 2 (TAT 6-24 HRS): SARS Coronavirus 2: NEGATIVE

## 2020-02-23 ENCOUNTER — Encounter: Payer: Self-pay | Admitting: General Surgery

## 2020-02-24 ENCOUNTER — Ambulatory Visit: Payer: Medicare HMO | Admitting: Registered Nurse

## 2020-02-24 ENCOUNTER — Encounter
Admission: RE | Disposition: A | Discharge status: Home / Self Care | Payer: Self-pay | Source: Home / Self Care | Attending: General Surgery

## 2020-02-24 ENCOUNTER — Ambulatory Visit
Admission: RE | Admit: 2020-02-24 | Discharge: 2020-02-24 | Disposition: A | Discharge status: Home / Self Care | Payer: Medicare HMO | Attending: General Surgery | Admitting: General Surgery

## 2020-02-24 ENCOUNTER — Other Ambulatory Visit: Payer: Self-pay

## 2020-02-24 ENCOUNTER — Encounter: Payer: Self-pay | Admitting: General Surgery

## 2020-02-24 DIAGNOSIS — Z79890 Hormone replacement therapy (postmenopausal): Secondary | ICD-10-CM | POA: Diagnosis not present

## 2020-02-24 DIAGNOSIS — Z86010 Personal history of colonic polyps: Secondary | ICD-10-CM | POA: Diagnosis not present

## 2020-02-24 DIAGNOSIS — E119 Type 2 diabetes mellitus without complications: Secondary | ICD-10-CM | POA: Insufficient documentation

## 2020-02-24 DIAGNOSIS — Z7984 Long term (current) use of oral hypoglycemic drugs: Secondary | ICD-10-CM | POA: Diagnosis not present

## 2020-02-24 DIAGNOSIS — E039 Hypothyroidism, unspecified: Secondary | ICD-10-CM | POA: Insufficient documentation

## 2020-02-24 DIAGNOSIS — K635 Polyp of colon: Secondary | ICD-10-CM | POA: Insufficient documentation

## 2020-02-24 DIAGNOSIS — K2950 Unspecified chronic gastritis without bleeding: Secondary | ICD-10-CM | POA: Diagnosis not present

## 2020-02-24 DIAGNOSIS — D123 Benign neoplasm of transverse colon: Secondary | ICD-10-CM | POA: Insufficient documentation

## 2020-02-24 DIAGNOSIS — F0390 Unspecified dementia without behavioral disturbance: Secondary | ICD-10-CM | POA: Diagnosis not present

## 2020-02-24 DIAGNOSIS — G4733 Obstructive sleep apnea (adult) (pediatric): Secondary | ICD-10-CM | POA: Diagnosis not present

## 2020-02-24 DIAGNOSIS — I8500 Esophageal varices without bleeding: Secondary | ICD-10-CM | POA: Insufficient documentation

## 2020-02-24 DIAGNOSIS — R1110 Vomiting, unspecified: Secondary | ICD-10-CM | POA: Insufficient documentation

## 2020-02-24 DIAGNOSIS — I10 Essential (primary) hypertension: Secondary | ICD-10-CM | POA: Insufficient documentation

## 2020-02-24 DIAGNOSIS — Z79899 Other long term (current) drug therapy: Secondary | ICD-10-CM | POA: Diagnosis not present

## 2020-02-24 DIAGNOSIS — Z1211 Encounter for screening for malignant neoplasm of colon: Secondary | ICD-10-CM | POA: Diagnosis not present

## 2020-02-24 DIAGNOSIS — D124 Benign neoplasm of descending colon: Secondary | ICD-10-CM | POA: Diagnosis not present

## 2020-02-24 DIAGNOSIS — K5730 Diverticulosis of large intestine without perforation or abscess without bleeding: Secondary | ICD-10-CM | POA: Diagnosis not present

## 2020-02-24 DIAGNOSIS — Z7982 Long term (current) use of aspirin: Secondary | ICD-10-CM | POA: Diagnosis not present

## 2020-02-24 HISTORY — PX: COLONOSCOPY WITH PROPOFOL: SHX5780

## 2020-02-24 HISTORY — DX: Hypothyroidism, unspecified: E03.9

## 2020-02-24 HISTORY — PX: ESOPHAGOGASTRODUODENOSCOPY: SHX5428

## 2020-02-24 HISTORY — DX: Personal history of urinary calculi: Z87.442

## 2020-02-24 LAB — GLUCOSE, CAPILLARY: Glucose-Capillary: 108 mg/dL — ABNORMAL HIGH (ref 70–99)

## 2020-02-24 SURGERY — EGD (ESOPHAGOGASTRODUODENOSCOPY)
Anesthesia: General

## 2020-02-24 MED ORDER — PROPOFOL 10 MG/ML IV BOLUS
INTRAVENOUS | Status: DC | PRN
  Administered 2020-02-24: 20 mg via INTRAVENOUS
  Administered 2020-02-24: 80 mg via INTRAVENOUS

## 2020-02-24 MED ORDER — DEXMEDETOMIDINE HCL 200 MCG/2ML IV SOLN
INTRAVENOUS | Status: DC | PRN
  Administered 2020-02-24: 20 ug via INTRAVENOUS

## 2020-02-24 MED ORDER — PROPOFOL 500 MG/50ML IV EMUL
INTRAVENOUS | Status: DC | PRN
  Administered 2020-02-24: 150 ug/kg/min via INTRAVENOUS

## 2020-02-24 MED ORDER — SODIUM CHLORIDE 0.9 % IV SOLN
INTRAVENOUS | Status: DC
  Administered 2020-02-24: 1000 mL via INTRAVENOUS

## 2020-02-24 MED ORDER — LIDOCAINE HCL (CARDIAC) PF 100 MG/5ML IV SOSY
PREFILLED_SYRINGE | INTRAVENOUS | Status: DC | PRN
  Administered 2020-02-24: 100 mg via INTRAVENOUS

## 2020-02-24 MED ORDER — GLYCOPYRROLATE 0.2 MG/ML IJ SOLN
INTRAMUSCULAR | Status: DC | PRN
  Administered 2020-02-24: .2 mg via INTRAVENOUS

## 2020-02-24 NOTE — Op Note (Signed)
[facility] Gastroenterology Patient Name: Tanner Oconnell Procedure Date: 02/24/2020 9:07 AM MRN: [id number] Account #: [id number] Date of Birth: 1945-12-28 Admit Type: Outpatient Age: 74 Room: [facility] ENDO ROOM 1 Gender: Male Note Status: Finalized Procedure:             Upper GI endoscopy Indications:           Suspected esophageal reflux Providers:             Robert Bellow, MD Referring MD:          Tracie Harrier, MD (Referring MD) Medicines:             Monitored Anesthesia Care Complications:         No immediate complications. Procedure:             Pre-Anesthesia Assessment:                        - Prior to the procedure, a History and Physical was                         performed, and patient medications, allergies and                         sensitivities were reviewed. The patient's tolerance                         of previous anesthesia was reviewed.                        - The risks and benefits of the procedure and the                         sedation options and risks were discussed with the                         patient. All questions were answered and informed                         consent was obtained.                        After obtaining informed consent, the endoscope was                         passed under direct vision. Throughout the procedure,                         the patient's blood pressure, pulse, and oxygen                         saturations were monitored continuously. The Endoscope                         was introduced through the mouth, and advanced to the                         second part of duodenum. The upper GI endoscopy was  accomplished without difficulty. The patient tolerated                         the procedure well. Findings:      Grade I varices were found in the middle third of the esophagus. They       were 4 mm in largest diameter.      Diffuse minimal inflammation was  found in the entire examined stomach.      The examined duodenum was normal. Impression:            - Grade I esophageal varices.                        - Chronic gastritis.                        - Normal examined duodenum.                        - No specimens collected. Recommendation:        - Perform a colonoscopy today. Procedure Code(s):     --- Professional ---                        [phone], Esophagogastroduodenoscopy, flexible,                         transoral; diagnostic, including collection of                         specimen(s) by brushing or washing, when performed                         (separate procedure) Diagnosis Code(s):     --- Professional ---                        I85.00, Esophageal varices without bleeding                        K29.50, Unspecified chronic gastritis without bleeding CPT copyright 2019 American Medical Association. All rights reserved. The codes documented in this report are preliminary and upon coder review may  be revised to meet current compliance requirements. Robert Bellow, MD 02/24/2020 9:30:03 AM This report has been signed electronically. Number of Addenda: 0 Note Initiated On: 02/24/2020 9:07 AM      [facility]

## 2020-02-24 NOTE — H&P (Signed)
Tanner Oconnell [id number] Apr 25, 1946     HPI:  History of intermittent regurgitation of dark fluid. Negative recent ENT exam. No history of dysphagia (per wife).  Past history of colonic polyps.  For upper and lower endoscopy.   Medications Prior to Admission  Medication Sig Dispense Refill Last Dose  . ascorbic acid (VITAMIN C) 1000 MG tablet Take 1,000 mg by mouth daily.     Marland Kitchen amLODipine (NORVASC) 5 MG tablet TAKE 1 TABLET ONCE DAILY   02/22/2020  . aspirin 81 MG EC tablet Take by mouth.   02/22/2020  . CALCIUM-VITAMIN D PO Take by mouth.     . docusate sodium (COLACE) 100 MG capsule Take 1 capsule (100 mg total) by mouth 2 (two) times daily. For constipation. 30 capsule 0 02/22/2020  . donepezil (ARICEPT) 10 MG tablet Take 10 mg by mouth daily.   02/22/2020  . fenofibrate (TRICOR) 145 MG tablet Take 1 tablet by mouth daily.   02/22/2020  . gabapentin (NEURONTIN) 100 MG capsule Take 1 capsule by mouth 2 (two) times daily.   02/22/2020  . glimepiride (AMARYL) 1 MG tablet Take 1 mg by mouth daily.   02/22/2020  . HYDROcodone-acetaminophen (NORCO/VICODIN) 5-325 MG tablet Take 1 tablet by mouth every 8 (eight) hours as needed. 10 tablet 0   . levothyroxine (SYNTHROID) 25 MCG tablet Take 25 mcg by mouth daily.   02/22/2020  . losartan-hydrochlorothiazide (HYZAAR) 100-25 MG tablet Take 1 tablet by mouth daily.   02/22/2020  . memantine (NAMENDA) 10 MG tablet Take 10 mg by mouth 2 (two) times daily.   02/22/2020  . metFORMIN (GLUCOPHAGE) 1000 MG tablet Take 1,000 mg by mouth 2 (two) times daily.   02/22/2020  . Multiple Vitamin (MULTI-VITAMIN) tablet Take by mouth.     . Omega-3 Fatty Acids (FISH OIL) 1000 MG CAPS Take by mouth.     . propranolol (INDERAL) 10 MG tablet Take 10 mg by mouth 2 (two) times daily.   02/22/2020  . sertraline (ZOLOFT) 100 MG tablet Take 100 mg by mouth daily.   02/22/2020  . tamsulosin (FLOMAX) 0.4 MG CAPS capsule Take 1 capsule (0.4 mg total) by mouth daily. 14 capsule 0 02/22/2020   . vitamin B-12 (CYANOCOBALAMIN) 1000 MCG tablet Take 1,000 mcg by mouth daily.      Allergies  Allergen Reactions  . Penicillins Rash    Unknown reaction Red bumps.   Past Medical History:  Diagnosis Date  . Dementia ([place])   . Diabetes mellitus without complication ([place])   . History of kidney stones   . Hyperlipidemia   . Hypertension   . Hypothyroidism   . Obstructive sleep apnea    refuses to wear cpap  . Thrombocytopenia ([place])   . Thyroid disease    Hypothyroidism   Past Surgical History:  Procedure Laterality Date  . BONE MARROW BIOPSY    . COLONOSCOPY     Social History   Socioeconomic History  . Marital status: Married    Spouse name: Not on file  . Number of children: Not on file  . Years of education: Not on file  . Highest education level: Not on file  Occupational History  . Not on file  Tobacco Use  . Smoking status: Never Smoker  . Smokeless tobacco: Former Systems developer    Types: Secondary school teacher  . Vaping Use: Never used  Substance and Sexual Activity  . Alcohol use: Never  . Drug use: Never  . Sexual  activity: Not on file  Other Topics Concern  . Not on file  Social History Narrative  . Not on file   Social Determinants of Health   Financial Resource Strain:   . Difficulty of Paying Living Expenses:   Food Insecurity:   . Worried About Charity fundraiser in the Last Year:   . Arboriculturist in the Last Year:   Transportation Needs:   . Film/video editor (Medical):   Marland Kitchen Lack of Transportation (Non-Medical):   Physical Activity:   . Days of Exercise per Week:   . Minutes of Exercise per Session:   Stress:   . Feeling of Stress :   Social Connections:   . Frequency of Communication with Friends and Family:   . Frequency of Social Gatherings with Friends and Family:   . Attends Religious Services:   . Active Member of Clubs or Organizations:   . Attends Archivist Meetings:   Marland Kitchen Marital Status:   Intimate Partner Violence:    . Fear of Current or Ex-Partner:   . Emotionally Abused:   Marland Kitchen Physically Abused:   . Sexually Abused:    Social History   Social History Narrative  . Not on file     ROS: Negative.     PE: HEENT: Negative. Lungs: Clear. Cardio: RR.  Assessment/Plan:  Proceed with planned endoscopy.   Forest Gleason [facility] 02/24/2020

## 2020-02-24 NOTE — Op Note (Signed)
[facility] Gastroenterology Patient Name: Tanner Oconnell Procedure Date: 02/24/2020 9:06 AM MRN: [id number] Account #: [id number] Date of Birth: 1945/12/11 Admit Type: Outpatient Age: 74 Room: [facility] ENDO ROOM 1 Gender: Male Note Status: Finalized Procedure:             Colonoscopy Indications:           High risk colon cancer surveillance: Personal history                         of colonic polyps Providers:             Robert Bellow, MD Referring MD:          Tracie Harrier, MD (Referring MD) Medicines:             Monitored Anesthesia Care Complications:         No immediate complications. Procedure:             Pre-Anesthesia Assessment:                        - Prior to the procedure, a History and Physical was                         performed, and patient medications, allergies and                         sensitivities were reviewed. The patient's tolerance                         of previous anesthesia was reviewed.                        - The risks and benefits of the procedure and the                         sedation options and risks were discussed with the                         patient. All questions were answered and informed                         consent was obtained.                        After obtaining informed consent, the colonoscope was                         passed under direct vision. Throughout the procedure,                         the patient's blood pressure, pulse, and oxygen                         saturations were monitored continuously. The                         Colonoscope was introduced through the anus and                         advanced to the the cecum, identified  by appendiceal                         orifice and ileocecal valve. The colonoscopy was                         performed without difficulty. The patient tolerated                         the procedure well. The quality of the bowel                          preparation was excellent. Findings:      Two sessile polyps were found in the proximal transverse colon. The       polyps were 6 mm in size. These were biopsied with a cold large-capacity       forceps for histology.      A 6 mm polyp was found in the descending colon. The polyp was sessile.       Biopsies were taken with a cold forceps for histology. Biopsies were       taken with a cold forceps for histology.      A 6 mm polyp was found in the recto-sigmoid colon. The polyp was       sessile. Biopsies were taken with a cold forceps for histology.      The retroflexed view of the distal rectum and anal verge was normal and       showed no anal or rectal abnormalities.      A few small-mouthed diverticula were found in the sigmoid colon. Impression:            - Two 6 mm polyps in the proximal transverse colon.                         Biopsied.                        - One 6 mm polyp in the descending colon. Biopsied.                        - One 6 mm polyp at the recto-sigmoid colon. Biopsied.                        - The distal rectum and anal verge are normal on                         retroflexion view. Recommendation:        - Telephone endoscopist for pathology results in 1                         week. Procedure Code(s):     --- Professional ---                        [phone], Colonoscopy, flexible; with biopsy, single or                         multiple Diagnosis Code(s):     --- Professional ---  K63.5, Polyp of colon                        Z86.010, Personal history of colonic polyps CPT copyright 2019 American Medical Association. All rights reserved. The codes documented in this report are preliminary and upon coder review may  be revised to meet current compliance requirements. Robert Bellow, MD 02/24/2020 9:57:14 AM This report has been signed electronically. Number of Addenda: 0 Note Initiated On: 02/24/2020 9:06 AM Scope Withdrawal Time: 0  hours 14 minutes 52 seconds  Total Procedure Duration: 0 hours 21 minutes 24 seconds       [facility]

## 2020-02-24 NOTE — Transfer of Care (Signed)
Immediate Anesthesia Transfer of Care Note  Patient: Tanner Oconnell  Procedure(s) Performed: ESOPHAGOGASTRODUODENOSCOPY (EGD) (N/A ) COLONOSCOPY WITH PROPOFOL (N/A )  Patient Location: PACU  Anesthesia Type:General  Level of Consciousness: drowsy  Airway & Oxygen Therapy: Patient Spontanous Breathing  Post-op Assessment: Report given to RN and Post -op Vital signs reviewed and stable  Post vital signs: Reviewed and stable  Last Vitals:  Vitals Value Taken Time  BP 104/63 02/24/20 0958  Temp    Pulse 54 02/24/20 0959  Resp 18 02/24/20 0959  SpO2 92 % 02/24/20 0959  Vitals shown include unvalidated device data.  Last Pain:  Vitals:   02/24/20 0958  TempSrc: (P) Temporal  PainSc:          Complications: No complications documented.

## 2020-02-24 NOTE — Anesthesia Postprocedure Evaluation (Signed)
Anesthesia Post Note  Patient: WALDEN STATZ  Procedure(s) Performed: ESOPHAGOGASTRODUODENOSCOPY (EGD) (N/A ) COLONOSCOPY WITH PROPOFOL (N/A )  Patient location during evaluation: PACU Anesthesia Type: General Level of consciousness: awake and alert Pain management: pain level controlled Vital Signs Assessment: post-procedure vital signs reviewed and stable Respiratory status: spontaneous breathing, nonlabored ventilation, respiratory function stable and patient connected to nasal cannula oxygen Cardiovascular status: blood pressure returned to baseline and stable Postop Assessment: no apparent nausea or vomiting Anesthetic complications: no   No complications documented.   Last Vitals:  Vitals:   02/24/20 0817 02/24/20 0958  BP: (!) 178/91 104/63  Pulse: 62 (!) 54  Resp: 18 18  Temp: (!) 36.2 C 36.6 C  SpO2: 97% 93%    Last Pain:  Vitals:   02/24/20 0958  TempSrc: Temporal  PainSc: Asleep                 Molli Barrows

## 2020-02-24 NOTE — Anesthesia Preprocedure Evaluation (Signed)
Anesthesia Evaluation  Patient identified by MRN, date of birth, ID band Patient awake    Reviewed: Allergy & Precautions, H&P , NPO status , Patient's Chart, lab work & pertinent test results, reviewed documented beta blocker date and time   History of Anesthesia Complications Negative for: history of anesthetic complications  Airway Mallampati: I  TM Distance: >3 FB Neck ROM: full    Dental  (+) Caps, Dental Advidsory Given, Teeth Intact   Pulmonary sleep apnea ,    Pulmonary exam normal breath sounds clear to auscultation       Cardiovascular Exercise Tolerance: Good hypertension, Normal cardiovascular exam Rhythm:regular Rate:Normal     Neuro/Psych PSYCHIATRIC DISORDERS Dementia negative neurological ROS     GI/Hepatic negative GI ROS, Neg liver ROS,   Endo/Other  diabetesHypothyroidism   Renal/GU negative Renal ROS  negative genitourinary   Musculoskeletal   Abdominal   Peds  Hematology negative hematology ROS (+)   Anesthesia Other Findings Past Medical History: No date: Dementia ([place]) No date: Diabetes mellitus without complication (HCC) No date: History of kidney stones No date: Hyperlipidemia No date: Hypertension No date: Hypothyroidism No date: Obstructive sleep apnea     Comment:  refuses to wear cpap No date: Thrombocytopenia (HCC) No date: Thyroid disease     Comment:  Hypothyroidism   Reproductive/Obstetrics negative OB ROS                             Anesthesia Physical Anesthesia Plan  ASA: III  Anesthesia Plan: General   Post-op Pain Management:    Induction: Intravenous  PONV Risk Score and Plan: 2 and Propofol infusion and TIVA  Airway Management Planned: Natural Airway and Nasal Cannula  Additional Equipment:   Intra-op Plan:   Post-operative Plan:   Informed Consent: I have reviewed the patients History and Physical, chart, labs and discussed  the procedure including the risks, benefits and alternatives for the proposed anesthesia with the patient or authorized representative who has indicated his/her understanding and acceptance.     Dental Advisory Given  Plan Discussed with: Anesthesiologist, CRNA and Surgeon  Anesthesia Plan Comments:         Anesthesia Quick Evaluation

## 2020-02-25 ENCOUNTER — Encounter: Payer: Self-pay | Admitting: General Surgery

## 2020-02-26 ENCOUNTER — Other Ambulatory Visit: Payer: Self-pay | Admitting: General Surgery

## 2020-02-26 DIAGNOSIS — R042 Hemoptysis: Secondary | ICD-10-CM

## 2020-02-26 LAB — SURGICAL PATHOLOGY

## 2020-03-08 ENCOUNTER — Other Ambulatory Visit: Payer: Medicare HMO

## 2020-03-08 ENCOUNTER — Other Ambulatory Visit: Payer: Self-pay | Admitting: General Surgery

## 2020-03-08 DIAGNOSIS — N182 Chronic kidney disease, stage 2 (mild): Secondary | ICD-10-CM

## 2020-03-10 ENCOUNTER — Other Ambulatory Visit: Payer: Self-pay

## 2020-03-10 ENCOUNTER — Ambulatory Visit
Admission: RE | Admit: 2020-03-10 | Discharge: 2020-03-10 | Disposition: A | Discharge status: Home / Self Care | Payer: Medicare HMO | Source: Ambulatory Visit | Attending: General Surgery | Admitting: General Surgery

## 2020-03-10 ENCOUNTER — Other Ambulatory Visit
Admission: RE | Admit: 2020-03-10 | Discharge: 2020-03-10 | Disposition: A | Discharge status: Home / Self Care | Payer: Medicare HMO | Source: Home / Self Care | Attending: General Surgery | Admitting: General Surgery

## 2020-03-10 DIAGNOSIS — N182 Chronic kidney disease, stage 2 (mild): Secondary | ICD-10-CM | POA: Insufficient documentation

## 2020-03-10 DIAGNOSIS — R042 Hemoptysis: Secondary | ICD-10-CM | POA: Diagnosis not present

## 2020-03-10 LAB — CREATININE, SERUM
Creatinine, Ser: 1.35 mg/dL — ABNORMAL HIGH (ref 0.61–1.24)
GFR calc Af Amer: 59 mL/min — ABNORMAL LOW (ref >60)
GFR calc non Af Amer: 51 mL/min — ABNORMAL LOW (ref >60)

## 2020-03-10 MED ORDER — IOHEXOL 350 MG/ML SOLN
100.0000 mL | Freq: Once | INTRAVENOUS | Status: AC | PRN
  Administered 2020-03-10: 75 mL via INTRAVENOUS

## 2020-05-02 ENCOUNTER — Other Ambulatory Visit: Payer: Self-pay | Admitting: Family Medicine

## 2020-06-13 ENCOUNTER — Other Ambulatory Visit (HOSPITAL_COMMUNITY): Payer: Self-pay | Admitting: Gastroenterology

## 2020-06-13 DIAGNOSIS — I8500 Esophageal varices without bleeding: Secondary | ICD-10-CM

## 2020-06-23 ENCOUNTER — Ambulatory Visit: Payer: Medicare HMO

## 2020-06-24 ENCOUNTER — Ambulatory Visit
Admission: RE | Admit: 2020-06-24 | Discharge: 2020-06-24 | Disposition: A | Discharge status: Home / Self Care | Payer: Medicare HMO | Source: Ambulatory Visit | Attending: Gastroenterology | Admitting: Gastroenterology

## 2020-06-24 ENCOUNTER — Other Ambulatory Visit: Payer: Self-pay

## 2020-06-24 DIAGNOSIS — I8500 Esophageal varices without bleeding: Secondary | ICD-10-CM | POA: Diagnosis not present

## 2020-06-24 DIAGNOSIS — R161 Splenomegaly, not elsewhere classified: Secondary | ICD-10-CM | POA: Diagnosis not present

## 2020-06-24 DIAGNOSIS — N281 Cyst of kidney, acquired: Secondary | ICD-10-CM | POA: Insufficient documentation

## 2020-11-28 DIAGNOSIS — D649 Anemia, unspecified: Secondary | ICD-10-CM | POA: Diagnosis not present

## 2020-11-28 DIAGNOSIS — E782 Mixed hyperlipidemia: Secondary | ICD-10-CM | POA: Diagnosis not present

## 2020-11-28 DIAGNOSIS — G4733 Obstructive sleep apnea (adult) (pediatric): Secondary | ICD-10-CM | POA: Diagnosis not present

## 2020-11-28 DIAGNOSIS — Z6832 Body mass index (BMI) 32.0-32.9, adult: Secondary | ICD-10-CM | POA: Diagnosis not present

## 2020-11-28 DIAGNOSIS — E1165 Type 2 diabetes mellitus with hyperglycemia: Secondary | ICD-10-CM | POA: Diagnosis not present

## 2020-11-28 DIAGNOSIS — I10 Essential (primary) hypertension: Secondary | ICD-10-CM | POA: Diagnosis not present

## 2020-11-28 DIAGNOSIS — Z125 Encounter for screening for malignant neoplasm of prostate: Secondary | ICD-10-CM | POA: Diagnosis not present

## 2020-11-28 DIAGNOSIS — R413 Other amnesia: Secondary | ICD-10-CM | POA: Diagnosis not present

## 2020-11-29 DIAGNOSIS — F32A Depression, unspecified: Secondary | ICD-10-CM | POA: Diagnosis not present

## 2020-11-29 DIAGNOSIS — Z0000 Encounter for general adult medical examination without abnormal findings: Secondary | ICD-10-CM | POA: Diagnosis not present

## 2020-11-29 DIAGNOSIS — H9190 Unspecified hearing loss, unspecified ear: Secondary | ICD-10-CM | POA: Diagnosis not present

## 2020-11-29 DIAGNOSIS — I10 Essential (primary) hypertension: Secondary | ICD-10-CM | POA: Diagnosis not present

## 2020-11-29 DIAGNOSIS — D696 Thrombocytopenia, unspecified: Secondary | ICD-10-CM | POA: Diagnosis not present

## 2020-11-29 DIAGNOSIS — E119 Type 2 diabetes mellitus without complications: Secondary | ICD-10-CM | POA: Diagnosis not present

## 2020-11-29 DIAGNOSIS — E782 Mixed hyperlipidemia: Secondary | ICD-10-CM | POA: Diagnosis not present

## 2020-11-29 DIAGNOSIS — E039 Hypothyroidism, unspecified: Secondary | ICD-10-CM | POA: Diagnosis not present

## 2020-11-29 DIAGNOSIS — M545 Low back pain, unspecified: Secondary | ICD-10-CM | POA: Diagnosis not present

## 2020-12-26 DIAGNOSIS — H9193 Unspecified hearing loss, bilateral: Secondary | ICD-10-CM | POA: Diagnosis not present

## 2020-12-26 DIAGNOSIS — R413 Other amnesia: Secondary | ICD-10-CM | POA: Diagnosis not present

## 2020-12-26 DIAGNOSIS — G4733 Obstructive sleep apnea (adult) (pediatric): Secondary | ICD-10-CM | POA: Diagnosis not present

## 2020-12-26 DIAGNOSIS — F0150 Vascular dementia without behavioral disturbance: Secondary | ICD-10-CM | POA: Diagnosis not present

## 2020-12-26 DIAGNOSIS — G309 Alzheimer's disease, unspecified: Secondary | ICD-10-CM | POA: Diagnosis not present

## 2020-12-26 DIAGNOSIS — F0280 Dementia in other diseases classified elsewhere without behavioral disturbance: Secondary | ICD-10-CM | POA: Diagnosis not present

## 2020-12-26 DIAGNOSIS — G4752 REM sleep behavior disorder: Secondary | ICD-10-CM | POA: Diagnosis not present

## 2021-01-06 DIAGNOSIS — F0150 Vascular dementia without behavioral disturbance: Secondary | ICD-10-CM | POA: Diagnosis not present

## 2021-01-06 DIAGNOSIS — G309 Alzheimer's disease, unspecified: Secondary | ICD-10-CM | POA: Diagnosis not present

## 2021-01-06 DIAGNOSIS — F0280 Dementia in other diseases classified elsewhere without behavioral disturbance: Secondary | ICD-10-CM | POA: Diagnosis not present

## 2021-01-06 DIAGNOSIS — H903 Sensorineural hearing loss, bilateral: Secondary | ICD-10-CM | POA: Diagnosis not present

## 2021-01-06 DIAGNOSIS — G4752 REM sleep behavior disorder: Secondary | ICD-10-CM | POA: Diagnosis not present

## 2021-01-06 DIAGNOSIS — G4733 Obstructive sleep apnea (adult) (pediatric): Secondary | ICD-10-CM | POA: Diagnosis not present

## 2021-02-02 ENCOUNTER — Observation Stay: Payer: Medicare HMO

## 2021-02-02 ENCOUNTER — Other Ambulatory Visit: Payer: Self-pay

## 2021-02-02 ENCOUNTER — Emergency Department: Payer: Medicare HMO

## 2021-02-02 ENCOUNTER — Inpatient Hospital Stay
Admission: EM | Admit: 2021-02-02 | Discharge: 2021-02-04 | DRG: 065 | Disposition: A | Discharge status: Home / Self Care | Payer: Medicare HMO | Attending: Internal Medicine | Admitting: Internal Medicine

## 2021-02-02 DIAGNOSIS — I10 Essential (primary) hypertension: Secondary | ICD-10-CM | POA: Diagnosis not present

## 2021-02-02 DIAGNOSIS — F0280 Dementia in other diseases classified elsewhere without behavioral disturbance: Secondary | ICD-10-CM | POA: Diagnosis present

## 2021-02-02 DIAGNOSIS — E1122 Type 2 diabetes mellitus with diabetic chronic kidney disease: Secondary | ICD-10-CM | POA: Diagnosis present

## 2021-02-02 DIAGNOSIS — Z79890 Hormone replacement therapy (postmenopausal): Secondary | ICD-10-CM | POA: Diagnosis not present

## 2021-02-02 DIAGNOSIS — E1129 Type 2 diabetes mellitus with other diabetic kidney complication: Secondary | ICD-10-CM | POA: Diagnosis present

## 2021-02-02 DIAGNOSIS — I4892 Unspecified atrial flutter: Secondary | ICD-10-CM | POA: Diagnosis present

## 2021-02-02 DIAGNOSIS — I4891 Unspecified atrial fibrillation: Secondary | ICD-10-CM | POA: Diagnosis present

## 2021-02-02 DIAGNOSIS — N183 Chronic kidney disease, stage 3 unspecified: Secondary | ICD-10-CM | POA: Diagnosis present

## 2021-02-02 DIAGNOSIS — Z66 Do not resuscitate: Secondary | ICD-10-CM | POA: Diagnosis present

## 2021-02-02 DIAGNOSIS — I129 Hypertensive chronic kidney disease with stage 1 through stage 4 chronic kidney disease, or unspecified chronic kidney disease: Secondary | ICD-10-CM | POA: Diagnosis not present

## 2021-02-02 DIAGNOSIS — R29700 NIHSS score 0: Secondary | ICD-10-CM | POA: Diagnosis present

## 2021-02-02 DIAGNOSIS — Z20822 Contact with and (suspected) exposure to covid-19: Secondary | ICD-10-CM | POA: Diagnosis present

## 2021-02-02 DIAGNOSIS — Z87891 Personal history of nicotine dependence: Secondary | ICD-10-CM

## 2021-02-02 DIAGNOSIS — I639 Cerebral infarction, unspecified: Secondary | ICD-10-CM | POA: Diagnosis not present

## 2021-02-02 DIAGNOSIS — E039 Hypothyroidism, unspecified: Secondary | ICD-10-CM | POA: Diagnosis not present

## 2021-02-02 DIAGNOSIS — I6523 Occlusion and stenosis of bilateral carotid arteries: Secondary | ICD-10-CM | POA: Diagnosis not present

## 2021-02-02 DIAGNOSIS — R251 Tremor, unspecified: Secondary | ICD-10-CM | POA: Diagnosis present

## 2021-02-02 DIAGNOSIS — N1831 Chronic kidney disease, stage 3a: Secondary | ICD-10-CM | POA: Diagnosis not present

## 2021-02-02 DIAGNOSIS — Z880 Allergy status to penicillin: Secondary | ICD-10-CM

## 2021-02-02 DIAGNOSIS — Z7982 Long term (current) use of aspirin: Secondary | ICD-10-CM

## 2021-02-02 DIAGNOSIS — R7989 Other specified abnormal findings of blood chemistry: Secondary | ICD-10-CM | POA: Diagnosis present

## 2021-02-02 DIAGNOSIS — I6389 Other cerebral infarction: Principal | ICD-10-CM | POA: Diagnosis present

## 2021-02-02 DIAGNOSIS — G459 Transient cerebral ischemic attack, unspecified: Secondary | ICD-10-CM | POA: Diagnosis not present

## 2021-02-02 DIAGNOSIS — Z7984 Long term (current) use of oral hypoglycemic drugs: Secondary | ICD-10-CM

## 2021-02-02 DIAGNOSIS — F32A Depression, unspecified: Secondary | ICD-10-CM | POA: Diagnosis present

## 2021-02-02 DIAGNOSIS — I4819 Other persistent atrial fibrillation: Secondary | ICD-10-CM | POA: Diagnosis not present

## 2021-02-02 DIAGNOSIS — Z79899 Other long term (current) drug therapy: Secondary | ICD-10-CM

## 2021-02-02 DIAGNOSIS — N1832 Chronic kidney disease, stage 3b: Secondary | ICD-10-CM | POA: Diagnosis not present

## 2021-02-02 DIAGNOSIS — R29818 Other symptoms and signs involving the nervous system: Secondary | ICD-10-CM | POA: Diagnosis not present

## 2021-02-02 DIAGNOSIS — G309 Alzheimer's disease, unspecified: Secondary | ICD-10-CM | POA: Diagnosis present

## 2021-02-02 DIAGNOSIS — F0390 Unspecified dementia without behavioral disturbance: Secondary | ICD-10-CM | POA: Diagnosis present

## 2021-02-02 DIAGNOSIS — R4702 Dysphasia: Secondary | ICD-10-CM | POA: Diagnosis present

## 2021-02-02 DIAGNOSIS — E785 Hyperlipidemia, unspecified: Secondary | ICD-10-CM | POA: Diagnosis not present

## 2021-02-02 DIAGNOSIS — G4733 Obstructive sleep apnea (adult) (pediatric): Secondary | ICD-10-CM | POA: Diagnosis present

## 2021-02-02 DIAGNOSIS — G319 Degenerative disease of nervous system, unspecified: Secondary | ICD-10-CM | POA: Diagnosis not present

## 2021-02-02 DIAGNOSIS — R778 Other specified abnormalities of plasma proteins: Secondary | ICD-10-CM | POA: Diagnosis not present

## 2021-02-02 DIAGNOSIS — E119 Type 2 diabetes mellitus without complications: Secondary | ICD-10-CM | POA: Diagnosis not present

## 2021-02-02 DIAGNOSIS — Z8673 Personal history of transient ischemic attack (TIA), and cerebral infarction without residual deficits: Secondary | ICD-10-CM | POA: Diagnosis not present

## 2021-02-02 LAB — BASIC METABOLIC PANEL
Anion gap: 6 (ref 5–15)
BUN: 22 mg/dL (ref 8–23)
CO2: 28 mmol/L (ref 22–32)
Calcium: 9.5 mg/dL (ref 8.9–10.3)
Chloride: 107 mmol/L (ref 98–111)
Creatinine, Ser: 1.24 mg/dL (ref 0.61–1.24)
GFR, Estimated: 60 mL/min — ABNORMAL LOW (ref >60)
Glucose, Bld: 128 mg/dL — ABNORMAL HIGH (ref 70–99)
Potassium: 4.4 mmol/L (ref 3.5–5.1)
Sodium: 141 mmol/L (ref 135–145)

## 2021-02-02 LAB — URINALYSIS, COMPLETE (UACMP) WITH MICROSCOPIC
Bacteria, UA: NONE SEEN
Bilirubin Urine: NEGATIVE
Glucose, UA: NEGATIVE mg/dL
Hgb urine dipstick: NEGATIVE
Ketones, ur: NEGATIVE mg/dL
Nitrite: NEGATIVE
Protein, ur: NEGATIVE mg/dL
Specific Gravity, Urine: 1.021 (ref 1.005–1.030)
pH: 6 (ref 5.0–8.0)

## 2021-02-02 LAB — RESP PANEL BY RT-PCR (FLU A&B, COVID) ARPGX2
Influenza A by PCR: NEGATIVE
Influenza B by PCR: NEGATIVE
SARS Coronavirus 2 by RT PCR: NEGATIVE

## 2021-02-02 LAB — CBC
HCT: 39 % (ref 39.0–52.0)
Hemoglobin: 13.2 g/dL (ref 13.0–17.0)
MCH: 29.7 pg (ref 26.0–34.0)
MCHC: 33.8 g/dL (ref 30.0–36.0)
MCV: 87.6 fL (ref 80.0–100.0)
Platelets: 148 10*3/uL — ABNORMAL LOW (ref 150–400)
RBC: 4.45 MIL/uL (ref 4.22–5.81)
RDW: 14 % (ref 11.5–15.5)
WBC: 4.4 10*3/uL (ref 4.0–10.5)
nRBC: 0 % (ref 0.0–0.2)

## 2021-02-02 LAB — CBG MONITORING, ED: Glucose-Capillary: 95 mg/dL (ref 70–99)

## 2021-02-02 LAB — TROPONIN I (HIGH SENSITIVITY)
Troponin I (High Sensitivity): 203 ng/L (ref <18)
Troponin I (High Sensitivity): 191 ng/L (ref <18)
Troponin I (High Sensitivity): 187 ng/L (ref <18)
Troponin I (High Sensitivity): 207 ng/L (ref <18)

## 2021-02-02 LAB — PROTIME-INR
INR: 1.1 (ref 0.8–1.2)
Prothrombin Time: 14.2 seconds (ref 11.4–15.2)

## 2021-02-02 LAB — GLUCOSE, CAPILLARY: Glucose-Capillary: 76 mg/dL (ref 70–99)

## 2021-02-02 LAB — APTT: aPTT: 40 seconds — ABNORMAL HIGH (ref 24–36)

## 2021-02-02 MED ORDER — ACETAMINOPHEN 325 MG PO TABS: 650.0000 mg | ORAL_TABLET | ORAL | Status: DC | PRN

## 2021-02-02 MED ORDER — ACETAMINOPHEN 650 MG RE SUPP: 650.0000 mg | RECTAL | Status: DC | PRN

## 2021-02-02 MED ORDER — LEVOTHYROXINE SODIUM 25 MCG PO TABS
25.0000 ug | ORAL_TABLET | Freq: Every day | ORAL | Status: DC
  Administered 2021-02-03 – 2021-02-04 (×2): 25 ug via ORAL
  Filled 2021-02-02 (×2): qty 1

## 2021-02-02 MED ORDER — GABAPENTIN 100 MG PO CAPS
100.0000 mg | ORAL_CAPSULE | Freq: Two times a day (BID) | ORAL | Status: DC
  Administered 2021-02-03 (×2): 100 mg via ORAL
  Filled 2021-02-02 (×2): qty 1

## 2021-02-02 MED ORDER — DONEPEZIL HCL 5 MG PO TABS
10.0000 mg | ORAL_TABLET | Freq: Every day | ORAL | Status: DC
  Administered 2021-02-02 – 2021-02-03 (×2): 10 mg via ORAL
  Filled 2021-02-02 (×3): qty 2

## 2021-02-02 MED ORDER — ASCORBIC ACID 500 MG PO TABS
1000.0000 mg | ORAL_TABLET | Freq: Every day | ORAL | Status: DC
  Administered 2021-02-03: 1000 mg via ORAL
  Filled 2021-02-02: qty 2

## 2021-02-02 MED ORDER — VITAMIN B-12 1000 MCG PO TABS
1000.0000 ug | ORAL_TABLET | Freq: Every day | ORAL | Status: DC
  Administered 2021-02-03: 1000 ug via ORAL
  Filled 2021-02-02: qty 1

## 2021-02-02 MED ORDER — AMLODIPINE BESYLATE 5 MG PO TABS: 5.0000 mg | ORAL_TABLET | Freq: Every day | ORAL | Status: DC

## 2021-02-02 MED ORDER — HEPARIN (PORCINE) 25000 UT/250ML-% IV SOLN
1500.0000 [IU]/h | INTRAVENOUS | Status: DC
  Administered 2021-02-02 – 2021-02-03 (×2): 1400 [IU]/h via INTRAVENOUS
  Filled 2021-02-02 (×2): qty 250

## 2021-02-02 MED ORDER — HYDROCHLOROTHIAZIDE 25 MG PO TABS: 25.0000 mg | ORAL_TABLET | Freq: Every day | ORAL | Status: DC

## 2021-02-02 MED ORDER — QUETIAPINE FUMARATE 25 MG PO TABS
25.0000 mg | ORAL_TABLET | Freq: Every day | ORAL | Status: DC
  Administered 2021-02-02 – 2021-02-03 (×2): 25 mg via ORAL
  Filled 2021-02-02 (×2): qty 1

## 2021-02-02 MED ORDER — FENOFIBRATE 160 MG PO TABS
160.0000 mg | ORAL_TABLET | Freq: Every day | ORAL | Status: DC
  Administered 2021-02-03: 160 mg via ORAL
  Filled 2021-02-02 (×2): qty 1

## 2021-02-02 MED ORDER — INSULIN ASPART 100 UNIT/ML IJ SOLN
0.0000 [IU] | Freq: Three times a day (TID) | INTRAMUSCULAR | Status: DC
  Administered 2021-02-03 (×2): 2 [IU] via SUBCUTANEOUS
  Filled 2021-02-02 (×2): qty 1

## 2021-02-02 MED ORDER — LEVOTHYROXINE SODIUM 25 MCG PO TABS: 25.0000 ug | ORAL_TABLET | Freq: Every day | ORAL | Status: DC

## 2021-02-02 MED ORDER — HYDRALAZINE HCL 20 MG/ML IJ SOLN: 5.0000 mg | INTRAMUSCULAR | Status: DC | PRN

## 2021-02-02 MED ORDER — FENOFIBRATE 160 MG PO TABS: 160.0000 mg | ORAL_TABLET | Freq: Every day | ORAL | Status: DC

## 2021-02-02 MED ORDER — LOSARTAN POTASSIUM-HCTZ 100-25 MG PO TABS: 1.0000 | ORAL_TABLET | Freq: Every day | ORAL | Status: DC

## 2021-02-02 MED ORDER — ASPIRIN EC 81 MG PO TBEC
81.0000 mg | DELAYED_RELEASE_TABLET | Freq: Every day | ORAL | Status: DC
  Administered 2021-02-03: 81 mg via ORAL
  Filled 2021-02-02: qty 1

## 2021-02-02 MED ORDER — SERTRALINE HCL 50 MG PO TABS
100.0000 mg | ORAL_TABLET | Freq: Every day | ORAL | Status: DC
  Administered 2021-02-03: 100 mg via ORAL
  Filled 2021-02-02: qty 2

## 2021-02-02 MED ORDER — ADULT MULTIVITAMIN W/MINERALS CH
1.0000 | ORAL_TABLET | Freq: Every day | ORAL | Status: DC
  Administered 2021-02-03: 1 via ORAL
  Filled 2021-02-02: qty 1

## 2021-02-02 MED ORDER — DOCUSATE SODIUM 100 MG PO CAPS: 100.0000 mg | ORAL_CAPSULE | Freq: Two times a day (BID) | ORAL | Status: DC | PRN

## 2021-02-02 MED ORDER — STROKE: EARLY STAGES OF RECOVERY BOOK: Freq: Once | Status: DC

## 2021-02-02 MED ORDER — CALCIUM CARBONATE-VITAMIN D 500-200 MG-UNIT PO TABS
1.0000 | ORAL_TABLET | Freq: Every day | ORAL | Status: DC
  Administered 2021-02-03: 1 via ORAL
  Filled 2021-02-02: qty 1

## 2021-02-02 MED ORDER — LORAZEPAM 0.5 MG PO TABS
0.5000 mg | ORAL_TABLET | Freq: Four times a day (QID) | ORAL | Status: DC | PRN
  Administered 2021-02-02 (×2): 0.5 mg via ORAL
  Filled 2021-02-02 (×2): qty 1

## 2021-02-02 MED ORDER — PROPRANOLOL HCL 10 MG PO TABS
10.0000 mg | ORAL_TABLET | Freq: Two times a day (BID) | ORAL | Status: DC
  Administered 2021-02-03 (×2): 10 mg via ORAL
  Filled 2021-02-02 (×3): qty 1

## 2021-02-02 MED ORDER — MEMANTINE HCL 10 MG PO TABS
10.0000 mg | ORAL_TABLET | Freq: Two times a day (BID) | ORAL | Status: DC
  Administered 2021-02-03 (×2): 10 mg via ORAL
  Filled 2021-02-02 (×4): qty 1

## 2021-02-02 MED ORDER — ENOXAPARIN SODIUM 60 MG/0.6ML IJ SOSY: 0.5000 mg/kg | PREFILLED_SYRINGE | INTRAMUSCULAR | Status: DC

## 2021-02-02 MED ORDER — OMEGA-3-ACID ETHYL ESTERS 1 G PO CAPS
1.0000 g | ORAL_CAPSULE | Freq: Every day | ORAL | Status: DC
  Administered 2021-02-03: 1 g via ORAL
  Filled 2021-02-02: qty 1

## 2021-02-02 MED ORDER — INSULIN ASPART 100 UNIT/ML IJ SOLN: 0.0000 [IU] | Freq: Every day | INTRAMUSCULAR | Status: DC

## 2021-02-02 MED ORDER — ATORVASTATIN CALCIUM 20 MG PO TABS
40.0000 mg | ORAL_TABLET | Freq: Every day | ORAL | Status: DC
  Administered 2021-02-02 – 2021-02-03 (×2): 40 mg via ORAL
  Filled 2021-02-02 (×2): qty 2

## 2021-02-02 MED ORDER — LOSARTAN POTASSIUM 50 MG PO TABS: 100.0000 mg | ORAL_TABLET | Freq: Every day | ORAL | Status: DC

## 2021-02-02 MED ORDER — ACETAMINOPHEN 160 MG/5ML PO SOLN
650.0000 mg | ORAL | Status: DC | PRN
  Filled 2021-02-02: qty 20.3

## 2021-02-02 MED ORDER — ONDANSETRON HCL 4 MG/2ML IJ SOLN: 4.0000 mg | Freq: Three times a day (TID) | INTRAMUSCULAR | Status: DC | PRN

## 2021-02-02 MED ORDER — HEPARIN BOLUS VIA INFUSION
4000.0000 [IU] | Freq: Once | INTRAVENOUS | Status: AC
  Administered 2021-02-02: 4000 [IU] via INTRAVENOUS
  Filled 2021-02-02: qty 4000

## 2021-02-02 NOTE — Consult Note (Signed)
NEURO HOSPITALIST CONSULT NOTE   Requestig physician: Dr. Blaine Hamper  Reason for Consult:Transient episode of slurred speech  History obtained from:  Patient's Wife and Chart     HPI:                                                                                                                                          Tanner Oconnell is an 75 y.o. male with a history of DM, nephrolithiasis, HLD, HTN, hypothyroidism, OSA, thyroid disease and thrombocytopenia presenting to the ED after sudden onset of slurred speech lasting for about 15 minutes. The patient has a history of dementia and is a poor historian. Wife states that symptoms occurred about 1/2 hour after the patient scratched the roof of his mouth with the straw he was using to sip hot coffee with. She is unsure if the speech change was due to pain along the patient's scratched palate, or whether it was due to a ministroke. She had called EMS who did a stroke screen which was negative. The patient was found to have new onset a-fib with EMS. He is on ASA daily.   Initial BP by EMS was 189/100. CBG was 125. He was satting at 96% on RA.   Past Medical History:  Diagnosis Date   Dementia ([place])    Diabetes mellitus without complication ([place])    History of kidney stones    Hyperlipidemia    Hypertension    Hypothyroidism    Obstructive sleep apnea    refuses to wear cpap   Thrombocytopenia (HCC)    Thyroid disease    Hypothyroidism    Past Surgical History:  Procedure Laterality Date   BONE MARROW BIOPSY     COLONOSCOPY     COLONOSCOPY WITH PROPOFOL N/A 02/24/2020   Procedure: COLONOSCOPY WITH PROPOFOL;  Surgeon: Robert Bellow, MD;  Location: [place];  Service: Gastroenterology;  Laterality: N/A;   ESOPHAGOGASTRODUODENOSCOPY N/A 02/24/2020   Procedure: ESOPHAGOGASTRODUODENOSCOPY (EGD);  Surgeon: Robert Bellow, MD;  Location: [facility] ENDOSCOPY;  Service: Gastroenterology;  Laterality: N/A;    Family  History  Problem Relation Age of Onset   Cancer Mother    Cancer Father             Social History:  reports that he has never smoked. He quit smokeless tobacco use about 11 years ago.  His smokeless tobacco use included chew. He reports that he does not drink alcohol and does not use drugs.  Allergies  Allergen Reactions   Penicillins Rash    Unknown reaction Red bumps.    MEDICATIONS:  Prior to Admission:  Medications Prior to Admission  Medication Sig Dispense Refill Last Dose   amLODipine (NORVASC) 5 MG tablet TAKE 1 TABLET ONCE DAILY   02/02/2021   ascorbic acid (VITAMIN C) 1000 MG tablet Take 1,000 mg by mouth daily.   02/02/2021   aspirin 81 MG EC tablet Take by mouth.   02/02/2021   CALCIUM-VITAMIN D PO Take by mouth.   02/02/2021   docusate sodium (COLACE) 100 MG capsule Take 1 capsule (100 mg total) by mouth 2 (two) times daily. For constipation. 30 capsule 0 Past Month   donepezil (ARICEPT) 10 MG tablet Take 10 mg by mouth daily.   02/01/2021   fenofibrate (TRICOR) 145 MG tablet Take 1 tablet by mouth daily.   02/01/2021   gabapentin (NEURONTIN) 100 MG capsule Take 1 capsule by mouth 2 (two) times daily.   02/02/2021   glimepiride (AMARYL) 1 MG tablet Take 1 tablet by mouth in the morning.   02/02/2021   glimepiride (AMARYL) 2 MG tablet Take 2 mg by mouth at bedtime.   02/01/2021   levothyroxine (SYNTHROID) 25 MCG tablet Take 25 mcg by mouth daily.   02/02/2021   losartan-hydrochlorothiazide (HYZAAR) 100-25 MG tablet Take 1 tablet by mouth daily.   02/02/2021   memantine (NAMENDA) 10 MG tablet Take 10 mg by mouth 2 (two) times daily.   02/02/2021   metFORMIN (GLUCOPHAGE) 1000 MG tablet Take 1,000 mg by mouth 2 (two) times daily.   02/02/2021   Multiple Vitamin (MULTI-VITAMIN) tablet Take by mouth.   02/02/2021   Omega-3 Fatty Acids (FISH OIL) 1000 MG CAPS Take by mouth.    02/02/2021   omeprazole (PRILOSEC) 20 MG capsule Take 20 mg by mouth daily.   02/02/2021   propranolol (INDERAL) 10 MG tablet Take 10 mg by mouth 2 (two) times daily.   02/02/2021   QUEtiapine (SEROQUEL) 25 MG tablet Take 25 mg by mouth at bedtime.   02/01/2021   sertraline (ZOLOFT) 100 MG tablet Take 100 mg by mouth daily.   02/02/2021   vitamin B-12 (CYANOCOBALAMIN) 1000 MCG tablet Take 1,000 mcg by mouth daily.   02/02/2021   fenofibrate (TRICOR) 145 MG tablet Take 1 tablet by mouth daily.      glimepiride (AMARYL) 1 MG tablet Take 1 mg by mouth daily. (Patient not taking: No sig reported)   Not Taking   HYDROcodone-acetaminophen (NORCO/VICODIN) 5-325 MG tablet Take 1 tablet by mouth every 8 (eight) hours as needed. (Patient not taking: Reported on 02/02/2021) 10 tablet 0 Not Taking   tamsulosin (FLOMAX) 0.4 MG CAPS capsule Take 1 capsule (0.4 mg total) by mouth daily. (Patient not taking: Reported on 02/02/2021) 14 capsule 0 Not Taking     ROS:  In the context of his poor memory, the patient has no complaints.    Blood pressure 133/90, pulse 62, temperature 98.3 F (36.8 C), temperature source Oral, resp. rate (!) 22, height _0  (1.88 m), SpO2 98 %.   General Examination:                                                                                                       Physical Exam  HEENT-  [place]/AT    Lungs- Respirations unlabored Extremities- Warm and well perfused  Neurological Examination Mental Status: Awake and alert. Speech is at times fluent, at times with word-salad quality which wife states is his baseline. Finger naming intact. Follows all commands. Poor orientation to place and time. Poor insight. Speech tangential at times. Attention is fair. The patient is cooperative and at times makes jokes.  Cranial Nerves: II: Temporal visual fields  grossly normal. PERRL.   III,IV, VI: No ptosis. EOMI. V,VII: Smile symmetric, facial temp sensation equal bilaterally VIII: Hearing intact to voice IX,X: No hypophonia XI: Symmetric XII: Midline tongue extension Motor: Right : Upper extremity   5/5    Left:     Upper extremity   5/5  Lower extremity   5/5     Lower extremity   5/5 Sensory: Temp and light touch intact throughout, bilaterally. No extinction to DSS.  Deep Tendon Reflexes: 1+ and symmetric throughout Plantars: Right: downgoing   Left: downgoing Cerebellar: No ataxia with FNF bilaterally  Gait: Deferred   Lab Results: Basic Metabolic Panel: Recent Labs  Lab 02/02/21 1331  NA 141  K 4.4  CL 107  CO2 28  GLUCOSE 128*  BUN 22  CREATININE 1.24  CALCIUM 9.5    CBC: Recent Labs  Lab 02/02/21 1331  WBC 4.4  HGB 13.2  HCT 39.0  MCV 87.6  PLT 148*    Cardiac Enzymes: No results for input(s): CKTOTAL, CKMB, CKMBINDEX, TROPONINI in the last 168 hours.  Lipid Panel: No results for input(s): CHOL, TRIG, HDL, CHOLHDL, VLDL, LDLCALC in the last 168 hours.  Imaging: CT Head Wo Contrast  Result Date: 02/02/2021 CLINICAL DATA:  Acute neuro deficit.  Slurred speech. EXAM: CT HEAD WITHOUT CONTRAST TECHNIQUE: Contiguous axial images were obtained from the base of the skull through the vertex without intravenous contrast. COMPARISON:  MRI head 10/07/2018 FINDINGS: Brain: Moderate atrophy with mild ventricular enlargement. Mild white matter changes. Negative for acute infarct, hemorrhage, mass. Vascular: Negative for hyperdense vessel Skull: Negative Sinuses/Orbits: Negative Other: None IMPRESSION: No acute abnormality. Generalized atrophy and mild chronic microvascular ischemic change in the white matter. Electronically Signed   By: Franchot Gallo M.D.   On: 02/02/2021 15:15     Assessment: 75 year old male with a history of dementia, DM, HLD, HTN, OSA, thyroid disease and thrombocytopenia presenting with a 10-15 minute  episode of slurred speech. Atrial fibrillation was newly diagnosed in the ED.  1. Exam reveals confusion and dysphasic speech pattern which wife confirms is his baseline. The mumbling speech that precipitated the ED visit has not recurred.  2. CT head: No acute abnormality. Generalized atrophy and mild chronic microvascular ischemic change in the white matter. 3. CHA2DS2-VASc score: 3 (4.6% combined yearly risk of stroke/TIA/'systemic embolism) 4. Elevated troponin at 187 5. New onset a-fib 6. MRI brain: Small 3 mm area of acute infarct in the right frontal operculum. Atrophy and mild chronic microvascular ischemic change. 7. MRA head: Severe focal stenosis posterior right M2 branch. Moderate stenosis left anterior M2 branch. Mild stenosis left P2 branch. 8. Carotid ultrasound completed with report pending.   Recommendations: 1. Will need to be started on a DOAC, but would obtain Cardiology consult for other management of his new onset a-fib. 2. TTE 3. Cardiac telemetry.  4. Statin. 5. Permissive HTN x 24 hours. Given advanced age, would use modified parameters: Treat if SBP > 180.  6. PT/OT/Speech  Electronically signed: Dr. Kerney Elbe 02/02/2021, 4:05 PM

## 2021-02-02 NOTE — ED Notes (Signed)
Patient returned from MRI.

## 2021-02-02 NOTE — ED Notes (Signed)
Dr Niu at bedside 

## 2021-02-02 NOTE — Consult Note (Signed)
ANTICOAGULATION CONSULT NOTE - Follow Up Consult  Pharmacy Consult for heparin Indication: chest pain/ACS  Allergies  Allergen Reactions   Penicillins Rash    Unknown reaction Red bumps.    Patient Measurements: Height: 6\' 2"  (188 cm) IBW/kg (Calculated) : 82.2 Heparin Dosing Weight: 104.9 kg  Vital Signs: Temp: 98.3 F (36.8 C) (06/30 1327) Temp Source: Oral (06/30 1327) BP: 133/90 (06/30 1329) Pulse Rate: 62 (06/30 1515)  Labs: Recent Labs    02/02/21 1331  HGB 13.2  HCT 39.0  PLT 148*  CREATININE 1.24  TROPONINIHS 203*    CrCl cannot be calculated (Unknown ideal weight.).   Medications:  Aspirin 81 mg daily PTA  Assessment:  75 y.o. male with medical history significant of HTN,  HLD, DM, OSA, dementia, and CKD, who presents with slurred speech. MRI of the brain showed an acute infarct in the right frontal operculum. Patient was also found to have new onset A fib. Pharmacy has been consulted for heparin dosing.  Hgb: 13.2 and plts: 148 Heparin Dosing Weight: 104.9 kg   Goal of Therapy:  Heparin level 0.3-0.7 units/ml Monitor platelets by anticoagulation protocol: Yes   Plan:  Give 4000 units bolus x 1 Start heparin infusion at 1400 units/hr Check anti-Xa level in 8 hours and daily while on heparin Continue to monitor H&H and platelets  Darnelle Bos, PharmD 02/02/2021,3:55 PM

## 2021-02-02 NOTE — ED Notes (Signed)
US at bedside

## 2021-02-02 NOTE — ED Provider Notes (Signed)
[facility] Emergency Department Provider Note  Time seen: 2:21 PM  I have reviewed the triage vital signs and the nursing notes.   HISTORY  Chief Complaint Atrial Fibrillation   HPI Tanner Oconnell is a 75 y.o. male with a past medical history of dementia, diabetes, hypertension, hyperlipidemia, presents to the emergency department for slurred speech.  According to the wife approximately an hour or so before arrival patient began having slurred speech.  Patient states it was because he was drinking coffee and a straw hit him in the top of the mouth but the wife states he was slurring his speech for 10 or 15 minutes and then slowly returned back to normal.  States he has a history of dementia but was otherwise acting his self.  They called EMS, EMS found the patient to be in new onset A. fib and the patient was transported to the emergency department.  Here the patient is awake and alert, he has baseline dementia but acting at his baseline.  Wife states his speech is back to normal.  Patient does appear to be in continued A. fib with a controlled rate around 70 bpm.  Past Medical History:  Diagnosis Date   Dementia ([place])    Diabetes mellitus without complication ([place])    History of kidney stones    Hyperlipidemia    Hypertension    Hypothyroidism    Obstructive sleep apnea    refuses to wear cpap   Thrombocytopenia ([place])    Thyroid disease    Hypothyroidism    There are no problems to display for this patient.   Past Surgical History:  Procedure Laterality Date   BONE MARROW BIOPSY     COLONOSCOPY     COLONOSCOPY WITH PROPOFOL N/A 02/24/2020   Procedure: COLONOSCOPY WITH PROPOFOL;  Surgeon: Robert Bellow, MD;  Location: [facility] ENDOSCOPY;  Service: Gastroenterology;  Laterality: N/A;   ESOPHAGOGASTRODUODENOSCOPY N/A 02/24/2020   Procedure: ESOPHAGOGASTRODUODENOSCOPY (EGD);  Surgeon: Robert Bellow, MD;  Location: [facility] ENDOSCOPY;  Service:  Gastroenterology;  Laterality: N/A;    Prior to Admission medications   Medication Sig Start Date End Date Taking? Authorizing Provider  amLODipine (NORVASC) 5 MG tablet TAKE 1 TABLET ONCE DAILY 06/19/19   [provider]  ascorbic acid (VITAMIN C) 1000 MG tablet Take 1,000 mg by mouth daily.    [provider]  aspirin 81 MG EC tablet Take by mouth.    [provider]  CALCIUM-VITAMIN D PO Take by mouth.    [provider]  docusate sodium (COLACE) 100 MG capsule Take 1 capsule (100 mg total) by mouth 2 (two) times daily. For constipation. 10/06/19   Coral Spikes, DO  donepezil (ARICEPT) 10 MG tablet Take 10 mg by mouth daily. 09/01/19   [provider]  fenofibrate (TRICOR) 145 MG tablet Take 1 tablet by mouth daily. 06/25/19 06/24/20  [provider]  gabapentin (NEURONTIN) 100 MG capsule Take 1 capsule by mouth 2 (two) times daily.    [provider]  glimepiride (AMARYL) 1 MG tablet Take 1 mg by mouth daily. 09/01/19   [provider]  HYDROcodone-acetaminophen (NORCO/VICODIN) 5-325 MG tablet Take 1 tablet by mouth every 8 (eight) hours as needed. 10/06/19   Coral Spikes, DO  levothyroxine (SYNTHROID) 25 MCG tablet Take 25 mcg by mouth daily. 09/01/19   [provider]  losartan-hydrochlorothiazide (HYZAAR) 100-25 MG tablet Take 1 tablet by mouth daily. 09/01/19   [provider]  memantine (NAMENDA) 10 MG tablet Take 10 mg by mouth 2 (two) times daily. 09/15/19   [provider]  metFORMIN (GLUCOPHAGE) 1000 MG tablet Take 1,000 mg by mouth 2 (two) times daily. 09/01/19   [provider]  Multiple Vitamin (MULTI-VITAMIN) tablet Take by mouth.    [provider]  Omega-3 Fatty Acids (FISH OIL) 1000 MG CAPS Take by mouth.    [provider]  propranolol (INDERAL) 10 MG tablet Take 10 mg by mouth 2 (two) times daily. 08/31/19   [provider]  sertraline (ZOLOFT) 100  MG tablet Take 100 mg by mouth daily. 09/07/19   [provider]  tamsulosin (FLOMAX) 0.4 MG CAPS capsule Take 1 capsule (0.4 mg total) by mouth daily. 10/06/19   Coral Spikes, DO  vitamin B-12 (CYANOCOBALAMIN) 1000 MCG tablet Take 1,000 mcg by mouth daily.    [provider]    Allergies  Allergen Reactions   Penicillins Rash    Unknown reaction Red bumps.    Family History  Problem Relation Age of Onset   Cancer Mother    Cancer Father     Social History Social History   Tobacco Use   Smoking status: Never   Smokeless tobacco: Former    Types: Chew    Quit date: 10/05/2009  Vaping Use   Vaping Use: Never used  Substance Use Topics   Alcohol use: Never   Drug use: Never    Review of Systems Per patient and wife. Constitutional: Negative for fever. Cardiovascular: Negative for chest pain. Respiratory: Negative for shortness of breath. Gastrointestinal: Negative for abdominal pain Musculoskeletal: Negative for musculoskeletal complaints Skin: Negative for skin complaints  Neurological: Slurred speech.  Now resolved.  No weakness or numbness.  No confusion over baseline. All other ROS negative  ____________________________________________   PHYSICAL EXAM:  VITAL SIGNS: ED Triage Vitals  Enc Vitals Group     BP 02/02/21 1329 133/90     Pulse Rate 02/02/21 1329 64     Resp 02/02/21 1327 20     Temp 02/02/21 1327 98.3 F (36.8 C)     Temp Source 02/02/21 1327 Oral     SpO2 02/02/21 1327 94 %     Weight --      Height 02/02/21 1327 '6\' 2"'  (1.88 m)     Head Circumference --      Peak Flow --      Pain Score --      Pain Loc --      Pain Edu? --      Excl. in [place]? --    Constitutional: Awake and alert, calm cooperative and in no distress. Eyes: Normal exam ENT      Head: Normocephalic and atraumatic.      Mouth/Throat: Mucous membranes are moist. Cardiovascular: Irregular rhythm rate around 60 or 70 bpm. Respiratory: Normal respiratory  effort without tachypnea nor retractions. Breath sounds are clear  Gastrointestinal: Soft and nontender. No distention.   Musculoskeletal: Nontender with normal range of motion in all extremities.  Neurologic:  Normal speech and language. No gross focal neurologic deficits.  Appears to have equal strength bilaterally. Skin:  Skin is warm, dry and intact.  Psychiatric: Mood and affect are normal. ____________________________________________    EKG  EKG viewed and interpreted by myself shows atrial fibrillation at 61 bpm with a narrow QRS, normal axis, normal intervals, nonspecific but no concerning ST changes.  ____________________________________________    RADIOLOGY  CT negative  ____________________________________________   INITIAL IMPRESSION / ASSESSMENT AND PLAN / ED COURSE  Pertinent labs & imaging results that were available during my care of the patient were reviewed by me and considered in my medical decision making (see chart for details).   Patient presents emergency department for slurred speech approximately 1 hour prior to arrival.  Resolved after 10 minutes or so.  Patient is back at baseline per wife currently.  Found to be in new onset atrial fibrillation.  Given the new onset atrial fibrillation with neurologic finding highly suspect TIA.  Patient's labs have also resulted showing a troponin elevation of 203.  Per EMS patient's initial heart rhythm was atrial fibrillation with rapid ventricular response around 180 although this was not captured on the monitor.  Upon arrival patient continues to be in A. fib but rate controlled around 60 or 70 bpm.  Troponin could be indicative of ACS versus demand ischemia.  Given the elevated troponin along with concern for TIA/CVA we will obtain a CT scan of the head start on a heparin infusion if CT is negative and admit to the hospitalist service for further work-up and treatment.  Patient and wife are agreeable this plan of care.  NIHSS of 0 currently.  CT scan of the head is negative for acute abnormality.  We will start on heparin infusion and admit to the hospitalist service.   Tanner Oconnell was evaluated in Emergency Department on 02/02/2021 for the symptoms described in the history of present illness. He was evaluated in the context of the global COVID-19 pandemic, which necessitated consideration that the patient might be at risk for infection with the SARS-CoV-2 virus that causes COVID-19. Institutional protocols and algorithms that pertain to the evaluation of patients at risk for COVID-19 are in a state of rapid change based on information released by regulatory bodies including the CDC and federal and state organizations. These policies and algorithms were followed during the patient's care in the ED.  CRITICAL CARE Performed by: Harvest Dark   Total critical care time: 30 minutes  Critical care time was exclusive of separately billable procedures and treating other patients.  Critical care was necessary to treat or prevent imminent or life-threatening deterioration.  Critical care was time spent personally by me on the following activities: development of treatment plan with patient and/or surrogate as well as nursing, discussions with consultants, evaluation of patient's response to treatment, examination of patient, obtaining history from patient or surrogate, ordering and performing treatments and interventions, ordering and review of laboratory studies, ordering and review of radiographic studies, pulse oximetry and re-evaluation of patient's condition.  ____________________________________________   FINAL CLINICAL IMPRESSION(S) / ED DIAGNOSES  TIA Elevated troponin   Harvest Dark, MD 02/02/21 1524

## 2021-02-02 NOTE — ED Notes (Signed)
Patient transported to MRI 

## 2021-02-02 NOTE — ED Notes (Signed)
Patient transported to CT 

## 2021-02-02 NOTE — H&P (Signed)
History and Physical    Tanner Oconnell YBW:[id number] DOB: 03-Feb-1946 DOA: 02/02/2021  Referring MD/NP/PA:   PCP: Tracie Harrier, MD   Patient coming from:  The patient is coming from home.  At baseline, pt is independent for most of ADL.        Chief Complaint: slurred speech  HPI: Tanner Oconnell is a 75 y.o. male with medical history significant of HTN,  HLD, DM, hypothyroidism, depression, OSA, dementia, CKD-3A, who presents with slurred speech.   Per his wife, pt had 1 episode of slurred speech at about 1 PM, which has lasted for about 10-15 minutes.  Patient does not have unilateral numbness , tingling, weakness in extremities.  No facial droop.  Patient has history of dementia and his mental status is at his baseline per his wife.  Patient does not have any chest pain, cough, shortness breath, fever or chills.  No nausea vomiting, diarrhea or abdominal pain.  No symptoms of UTI.  Patient was found to have new onset atrial fibrillation with heart rate up to 180s.  Currently heart rate is 60-70s.  ED Course: pt was found to have troponin level 23 --> 191, negative COVID PCR, negative urinalysis, renal function close to baseline, temperature normal, blood pressure 133/90, RR 20, oxygen sat 94% on room air.  CT head is negative for acute intracranial abnormalities.  Patient is placed on progressive bed for observation.  Message sent to Dr. Rockey Situ of cardiology for consultation.  Dr. Cheral Marker of neurology is consulted.  MRI of brain and MRA of head: Small 3 mm area of acute infarct in the right frontal operculum.   Atrophy and mild chronic microvascular ischemic change   Severe focal stenosis posterior right M2 branch. Moderate stenosis left anterior M2 branch. Mild stenosis left P2 branch.  Review of Systems:   General: no fevers, chills, no body weight gain, has fatigue HEENT: no blurry vision, hearing changes or sore throat Respiratory: no dyspnea, coughing, wheezing CV: no  chest pain, no palpitations GI: no nausea, vomiting, abdominal pain, diarrhea, constipation GU: no dysuria, burning on urination, increased urinary frequency, hematuria  Ext: no leg edema Neuro: no unilateral weakness, numbness, or tingling, no vision change or hearing loss. Had slurred speech Skin: no rash, no skin tear. MSK: No muscle spasm, no deformity, no limitation of range of movement in spin Heme: No easy bruising.  Travel history: No recent long distant travel.  Allergy:  Allergies  Allergen Reactions   Penicillins Rash    Unknown reaction Red bumps.    Past Medical History:  Diagnosis Date   Dementia ([place])    Diabetes mellitus without complication ([place])    History of kidney stones    Hyperlipidemia    Hypertension    Hypothyroidism    Obstructive sleep apnea    refuses to wear cpap   Thrombocytopenia ([facility])    Thyroid disease    Hypothyroidism    Past Surgical History:  Procedure Laterality Date   BONE MARROW BIOPSY     COLONOSCOPY     COLONOSCOPY WITH PROPOFOL N/A 02/24/2020   Procedure: COLONOSCOPY WITH PROPOFOL;  Surgeon: Robert Bellow, MD;  Location: [facility];  Service: Gastroenterology;  Laterality: N/A;   ESOPHAGOGASTRODUODENOSCOPY N/A 02/24/2020   Procedure: ESOPHAGOGASTRODUODENOSCOPY (EGD);  Surgeon: Robert Bellow, MD;  Location: [facility] ENDOSCOPY;  Service: Gastroenterology;  Laterality: N/A;    Social History:  reports that he has never smoked. He quit smokeless tobacco use about 11 years  ago.  His smokeless tobacco use included chew. He reports that he does not drink alcohol and does not use drugs.  Family History:  Family History  Problem Relation Age of Onset   Cancer Mother    Cancer Father      Prior to Admission medications   Medication Sig Start Date End Date Taking? Authorizing Provider  amLODipine (NORVASC) 5 MG tablet TAKE 1 TABLET ONCE DAILY 06/19/19   [provider]  ascorbic acid (VITAMIN C) 1000 MG tablet  Take 1,000 mg by mouth daily.    [provider]  aspirin 81 MG EC tablet Take by mouth.    [provider]  CALCIUM-VITAMIN D PO Take by mouth.    [provider]  docusate sodium (COLACE) 100 MG capsule Take 1 capsule (100 mg total) by mouth 2 (two) times daily. For constipation. 10/06/19   Coral Spikes, DO  donepezil (ARICEPT) 10 MG tablet Take 10 mg by mouth daily. 09/01/19   [provider]  fenofibrate (TRICOR) 145 MG tablet Take 1 tablet by mouth daily. 06/25/19 06/24/20  [provider]  gabapentin (NEURONTIN) 100 MG capsule Take 1 capsule by mouth 2 (two) times daily.    [provider]  glimepiride (AMARYL) 1 MG tablet Take 1 mg by mouth daily. 09/01/19   [provider]  HYDROcodone-acetaminophen (NORCO/VICODIN) 5-325 MG tablet Take 1 tablet by mouth every 8 (eight) hours as needed. 10/06/19   Coral Spikes, DO  levothyroxine (SYNTHROID) 25 MCG tablet Take 25 mcg by mouth daily. 09/01/19   [provider]  losartan-hydrochlorothiazide (HYZAAR) 100-25 MG tablet Take 1 tablet by mouth daily. 09/01/19   [provider]  memantine (NAMENDA) 10 MG tablet Take 10 mg by mouth 2 (two) times daily. 09/15/19   [provider]  metFORMIN (GLUCOPHAGE) 1000 MG tablet Take 1,000 mg by mouth 2 (two) times daily. 09/01/19   [provider]  Multiple Vitamin (MULTI-VITAMIN) tablet Take by mouth.    [provider]  Omega-3 Fatty Acids (FISH OIL) 1000 MG CAPS Take by mouth.    [provider]  propranolol (INDERAL) 10 MG tablet Take 10 mg by mouth 2 (two) times daily. 08/31/19   [provider]  sertraline (ZOLOFT) 100 MG tablet Take 100 mg by mouth daily. 09/07/19   [provider]  tamsulosin (FLOMAX) 0.4 MG CAPS capsule Take 1 capsule (0.4 mg total) by mouth daily. 10/06/19   Coral Spikes, DO  vitamin B-12 (CYANOCOBALAMIN) 1000 MCG tablet Take 1,000 mcg by mouth daily.    [provider]    Physical Exam: Vitals:   02/02/21 1515 02/02/21 1612 02/02/21 1615 02/02/21 1745  BP:      Pulse: 62  (!) 29   Resp: (!) '22  17 14  ' Temp:      TempSrc:      SpO2: 98%     Weight:  110 kg    Height:  '6\' 2"'  (1.88 m)     General: Not in acute distress HEENT:       Eyes: PERRL, EOMI, no scleral icterus.       ENT: No discharge from the ears and nose, no pharynx injection, no tonsillar enlargement.        Neck: No JVD, no bruit, no mass felt. Heme: No neck lymph node enlargement. Cardiac: S1/S2, irregularly irregular rhythm, No murmurs, No gallops or rubs. Respiratory: No rales, wheezing, rhonchi or rubs. GI: Soft, nondistended, nontender,  no rebound pain, no organomegaly, BS present. GU: No hematuria Ext: No pitting leg edema bilaterally. 1+DP/PT pulse bilaterally. Musculoskeletal: No joint deformities, No joint redness or warmth, no limitation of ROM in spin. Skin: No rashes.  Neuro: Alert, oriented X3, cranial nerves II-XII grossly intact, moves all extremities normally. Muscle strength 5/5 in all extremities, sensation to light touch intact. Brachial reflex 2+ bilaterally.  Psych: Patient is not psychotic, no suicidal or hemocidal ideation.  Labs on Admission: I have personally reviewed following labs and imaging studies  CBC: Recent Labs  Lab 02/02/21 1331  WBC 4.4  HGB 13.2  HCT 39.0  MCV 87.6  PLT 694*   Basic Metabolic Panel: Recent Labs  Lab 02/02/21 1331  NA 141  K 4.4  CL 107  CO2 28  GLUCOSE 128*  BUN 22  CREATININE 1.24  CALCIUM 9.5   GFR: Estimated Creatinine Clearance: 65.8 mL/min (by C-G formula based on SCr of 1.24 mg/dL). Liver Function Tests: No results for input(s): AST, ALT, ALKPHOS, BILITOT, PROT, ALBUMIN in the last 168 hours. No results for input(s): LIPASE, AMYLASE in the last 168 hours. No results for input(s): AMMONIA in the last 168 hours. Coagulation Profile: Recent Labs  Lab 02/02/21 1640  INR 1.1   Cardiac  Enzymes: No results for input(s): CKTOTAL, CKMB, CKMBINDEX, TROPONINI in the last 168 hours. BNP (last 3 results) No results for input(s): PROBNP in the last 8760 hours. HbA1C: No results for input(s): HGBA1C in the last 72 hours. CBG: Recent Labs  Lab 02/02/21 1725  GLUCAP 95   Lipid Profile: No results for input(s): CHOL, HDL, LDLCALC, TRIG, CHOLHDL, LDLDIRECT in the last 72 hours. Thyroid Function Tests: No results for input(s): TSH, T4TOTAL, FREET4, T3FREE, THYROIDAB in the last 72 hours. Anemia Panel: No results for input(s): VITAMINB12, FOLATE, FERRITIN, TIBC, IRON, RETICCTPCT in the last 72 hours. Urine analysis:    Component Value Date/Time   COLORURINE YELLOW (A) 02/02/2021 1528   APPEARANCEUR HAZY (A) 02/02/2021 1528   LABSPEC 1.021 02/02/2021 1528   PHURINE 6.0 02/02/2021 1528   GLUCOSEU NEGATIVE 02/02/2021 1528   HGBUR NEGATIVE 02/02/2021 1528   BILIRUBINUR NEGATIVE 02/02/2021 1528   KETONESUR NEGATIVE 02/02/2021 1528   PROTEINUR NEGATIVE 02/02/2021 1528   NITRITE NEGATIVE 02/02/2021 1528   LEUKOCYTESUR SMALL (A) 02/02/2021 1528   Sepsis Labs: '@LABRCNTIP' (procalcitonin:4,lacticidven:4) ) Recent Results (from the past 240 hour(s))  Resp Panel by RT-PCR (Flu A&B, Covid)     Status: None   Collection Time: 02/02/21  3:24 PM   Specimen: Nasopharyngeal(NP) swabs in vial transport medium  Result Value Ref Range Status   SARS Coronavirus 2 by RT PCR NEGATIVE NEGATIVE Final    Comment: (NOTE) SARS-CoV-2 target nucleic acids are NOT DETECTED.  The SARS-CoV-2 RNA is generally detectable in upper respiratory specimens during the acute phase of infection. The lowest concentration of SARS-CoV-2 viral copies this assay can detect is 138 copies/mL. A negative result does not preclude SARS-Cov-2 infection and should not be used as the sole basis for treatment or other patient management decisions. A negative result may occur with  improper specimen collection/handling,  submission of specimen other than nasopharyngeal swab, presence of viral mutation(s) within the areas targeted by this assay, and inadequate number of viral copies(<138 copies/mL). A negative result must be combined with clinical observations, patient history, and epidemiological information. The expected result is Negative.  Fact Sheet for Patients:  EntrepreneurPulse.com.au  Fact Sheet for Healthcare Providers:  IncredibleEmployment.be  This test is  no t yet approved or cleared by the [place] and  has been authorized for detection and/or diagnosis of SARS-CoV-2 by FDA under an Emergency Use Authorization (EUA). This EUA will remain  in effect (meaning this test can be used) for the duration of the COVID-19 declaration under Section 564(b)(1) of the Act, 21 U.S.C.section 360bbb-3(b)(1), unless the authorization is terminated  or revoked sooner.       Influenza A by PCR NEGATIVE NEGATIVE Final   Influenza B by PCR NEGATIVE NEGATIVE Final    Comment: (NOTE) The Xpert Xpress SARS-CoV-2/FLU/RSV plus assay is intended as an aid in the diagnosis of influenza from Nasopharyngeal swab specimens and should not be used as a sole basis for treatment. Nasal washings and aspirates are unacceptable for Xpert Xpress SARS-CoV-2/FLU/RSV testing.  Fact Sheet for Patients: EntrepreneurPulse.com.au  Fact Sheet for Healthcare Providers: IncredibleEmployment.be  This test is not yet approved or cleared by the [place] FDA and has been authorized for detection and/or diagnosis of SARS-CoV-2 by FDA under an Emergency Use Authorization (EUA). This EUA will remain in effect (meaning this test can be used) for the duration of the COVID-19 declaration under Section 564(b)(1) of the Act, 21 U.S.C. section 360bbb-3(b)(1), unless the authorization is terminated or revoked.  Performed at [facility], [address], [place], [place] [address]      Radiological Exams on Admission: CT Head Wo Contrast  Result Date: 02/02/2021 CLINICAL DATA:  Acute neuro deficit.  Slurred speech. EXAM: CT HEAD WITHOUT CONTRAST TECHNIQUE: Contiguous axial images were obtained from the base of the skull through the vertex without intravenous contrast. COMPARISON:  MRI head 10/07/2018 FINDINGS: Brain: Moderate atrophy with mild ventricular enlargement. Mild white matter changes. Negative for acute infarct, hemorrhage, mass. Vascular: Negative for hyperdense vessel Skull: Negative Sinuses/Orbits: Negative Other: None IMPRESSION: No acute abnormality. Generalized atrophy and mild chronic microvascular ischemic change in the white matter. Electronically Signed   By: Franchot Gallo M.D.   On: 02/02/2021 15:15   MR ANGIO HEAD WO CONTRAST  Result Date: 02/02/2021 CLINICAL DATA:  TIA.  Slurred speech EXAM: MRI HEAD WITHOUT CONTRAST MRA HEAD WITHOUT CONTRAST TECHNIQUE: Multiplanar, multi-echo pulse sequences of the brain and surrounding structures were acquired without intravenous contrast. Angiographic images of the Circle of Willis were acquired using MRA technique without intravenous contrast. COMPARISON:  CT head 02/02/2021 FINDINGS: MRI HEAD FINDINGS Brain: 3 mm diffusion positivity in the right frontal operculum compatible with acute infarct. No other areas of restricted diffusion Generalized atrophy without hydrocephalus. Mild white matter changes consistent with chronic microvascular ischemia. Negative for hemorrhage or mass. Vascular: Normal arterial flow voids. Skull and upper cervical spine: Negative Sinuses/Orbits: With mucosal edema left contraction maxillary. Bilateral sinus cataract extraction Other: None MRA HEAD FINDINGS Anterior circulation: Internal carotid artery widely patent bilaterally. Hypoplastic left A1 segment. Both anterior cerebral arteries are patent supplied primarily from the right. Middle cerebral  arteries patent bilaterally. Severe focal stenosis in posterior right M2 branch. Moderate stenosis left anterior M2 branch. No vascular malformation or large vessel occlusion. Posterior circulation: Left vertebral artery dominant. Both vertebral arteries patent to the basilar. PICA not included on the study. AICA patent bilaterally. Basilar widely patent. Superior cerebellar and posterior cerebral arteries patent bilaterally. Mild stenosis in the left P2 segment. No vascular malformation Anatomic variants: None IMPRESSION: Small 3 mm area of acute infarct in the right frontal operculum. Atrophy and mild chronic microvascular ischemic change Severe focal stenosis posterior right M2 branch.  Moderate stenosis left anterior M2 branch. Mild stenosis left P2 branch. Electronically Signed   By: Franchot Gallo M.D.   On: 02/02/2021 17:04   MR BRAIN WO CONTRAST  Result Date: 02/02/2021 CLINICAL DATA:  TIA.  Slurred speech EXAM: MRI HEAD WITHOUT CONTRAST MRA HEAD WITHOUT CONTRAST TECHNIQUE: Multiplanar, multi-echo pulse sequences of the brain and surrounding structures were acquired without intravenous contrast. Angiographic images of the Circle of Willis were acquired using MRA technique without intravenous contrast. COMPARISON:  CT head 02/02/2021 FINDINGS: MRI HEAD FINDINGS Brain: 3 mm diffusion positivity in the right frontal operculum compatible with acute infarct. No other areas of restricted diffusion Generalized atrophy without hydrocephalus. Mild white matter changes consistent with chronic microvascular ischemia. Negative for hemorrhage or mass. Vascular: Normal arterial flow voids. Skull and upper cervical spine: Negative Sinuses/Orbits: With mucosal edema left contraction maxillary. Bilateral sinus cataract extraction Other: None MRA HEAD FINDINGS Anterior circulation: Internal carotid artery widely patent bilaterally. Hypoplastic left A1 segment. Both anterior cerebral arteries are patent supplied primarily  from the right. Middle cerebral arteries patent bilaterally. Severe focal stenosis in posterior right M2 branch. Moderate stenosis left anterior M2 branch. No vascular malformation or large vessel occlusion. Posterior circulation: Left vertebral artery dominant. Both vertebral arteries patent to the basilar. PICA not included on the study. AICA patent bilaterally. Basilar widely patent. Superior cerebellar and posterior cerebral arteries patent bilaterally. Mild stenosis in the left P2 segment. No vascular malformation Anatomic variants: None IMPRESSION: Small 3 mm area of acute infarct in the right frontal operculum. Atrophy and mild chronic microvascular ischemic change Severe focal stenosis posterior right M2 branch. Moderate stenosis left anterior M2 branch. Mild stenosis left P2 branch. Electronically Signed   By: Franchot Gallo M.D.   On: 02/02/2021 17:04     EKG: I have personally reviewed.  Atrial fibrillation, QTC 406, right axis deviation, poor R wave progression.  Assessment/Plan Principal Problem:   Stroke [facility]) Active Problems:   Dementia (HCC)   Type II diabetes mellitus with renal manifestations (HCC)   Hyperlipidemia   Hypothyroidism   Hypertension   Elevated troponin   New onset atrial fibrillation (HCC)   Atrial fibrillation with RVR (HCC)   Depression   CKD (chronic kidney disease), stage IIIa   Stroke [facility]): MRI of brain and MRA of head showed a small 3 mm area of acute infarct in the right frontal operculum. Severe focal stenosis posterior right M2 branch. Moderate stenosis left anterior M2 branch. Mild stenosis left P2 branch. Dr. Cheral Marker of neuro is consulted.  - Placed on progressive bed for observation - will follow up Neurology's Recs.  - will hold Hyzaar and amlodipine to allow permissive HTN in the setting of acute stroke, for SBP>220 or dBP>120 - Check carotid dopplers  - Continue ASA -  add lipitor 40 mg daily - fasting lipid panel and HbA1c  - 2D  transthoracic echocardiography  - swallowing screen. If fails, will get SLP - PT/OT consult  Dementia ([place]) Donepezil and Namenda  Type II diabetes mellitus with renal manifestations ([place]): No A1c available.  Blood sugar 128.  Patient is taking metformin and Amaryl -Sliding scale insulin  Hyperlipidemia -Started Lipitor 40 mg daily -Continue fenofibrate  Hypothyroidism -Synthroid  Hypertension -Hold amlodipine and Hyzaar to allow permissive hypertension -IV hydralazine as needed -Continue propranolol due to A. Fib  Elevated troponin: Troponin level 203, 191.  No chest pain.  Likely due to demand ischemia -Trend troponin -Aspirin, Lipitor -Follow-up A1c, FLP  New  onset atrial fibrillation with RVR: CHA2DS2-VASc Score is 6. Will need chronic anticoagulants. -IV heparin is started by EDP which is reasonable -on propranolol -Consulted Dr. Rockey Situ for cardiology  Depression -Continue home medications, Seroquel, Zoloft  CKD (chronic kidney disease), stage IIIa: Stable -Follow-up with BMP         DVT ppx: on IV Heparin     Code Status: DNR per his wife Family Communication:  Yes, patient's husband at bed side Disposition Plan:  Anticipate discharge back to previous environment Consults called:   Message sent to Dr. Rockey Situ for cardiology for consultation.  Dr. Cheral Marker of neurology is consulted. Admission status and Level of care: Progressive Cardiac:     for obs   Status is: Observation  The patient remains OBS appropriate and will d/c before 2 midnights.  Dispo: The patient is from: Home              Anticipated d/c is to: Home              Patient currently is not medically stable to d/c.   Difficult to place patient No           Date of Service 02/02/2021    [facility]   If 7PM-7AM, please contact night-coverage [email or web address] 02/02/2021, 5:47 PM

## 2021-02-02 NOTE — Progress Notes (Signed)
Patient scored a 2 on NIH scale, was unable to answer questions correctly. Patient has history of dementia. Wife says that this is his baseline. Will continue to observe. Patient scores 0 on other tasks.

## 2021-02-02 NOTE — ED Notes (Signed)
Neurology at bedside.

## 2021-02-02 NOTE — ED Triage Notes (Addendum)
Pt to ER via [facility] from home. Wife called Ems due to a sudden onset of slurred speech, slurring has now resolved, negative stroke screen with EMS. Wife reports that patient was drinking coffee with a straw and said that his speech was slurred because the straw cut into the roof of his mouth. Pt found to have a new onset of afib with [facility]ms. Takes daily aspirin.   Hx dementia. Alert, oriented to self and birthday. Wife [place].   EMS VSS- 1) BP 189/100, 2) BP 162/90. Cbg 125, HR 71. 96% RA.

## 2021-02-03 ENCOUNTER — Inpatient Hospital Stay (HOSPITAL_COMMUNITY)
Admit: 2021-02-03 | Discharge: 2021-02-03 | Disposition: A | Discharge status: Home / Self Care | Payer: Medicare HMO | Attending: Internal Medicine | Admitting: Internal Medicine

## 2021-02-03 DIAGNOSIS — Z7984 Long term (current) use of oral hypoglycemic drugs: Secondary | ICD-10-CM | POA: Diagnosis not present

## 2021-02-03 DIAGNOSIS — I4892 Unspecified atrial flutter: Secondary | ICD-10-CM | POA: Diagnosis present

## 2021-02-03 DIAGNOSIS — E785 Hyperlipidemia, unspecified: Secondary | ICD-10-CM | POA: Diagnosis present

## 2021-02-03 DIAGNOSIS — G459 Transient cerebral ischemic attack, unspecified: Secondary | ICD-10-CM | POA: Diagnosis present

## 2021-02-03 DIAGNOSIS — N1831 Chronic kidney disease, stage 3a: Secondary | ICD-10-CM | POA: Diagnosis present

## 2021-02-03 DIAGNOSIS — G309 Alzheimer's disease, unspecified: Secondary | ICD-10-CM | POA: Diagnosis present

## 2021-02-03 DIAGNOSIS — Z79890 Hormone replacement therapy (postmenopausal): Secondary | ICD-10-CM | POA: Diagnosis not present

## 2021-02-03 DIAGNOSIS — E039 Hypothyroidism, unspecified: Secondary | ICD-10-CM | POA: Diagnosis present

## 2021-02-03 DIAGNOSIS — I10 Essential (primary) hypertension: Secondary | ICD-10-CM | POA: Diagnosis not present

## 2021-02-03 DIAGNOSIS — I129 Hypertensive chronic kidney disease with stage 1 through stage 4 chronic kidney disease, or unspecified chronic kidney disease: Secondary | ICD-10-CM | POA: Diagnosis present

## 2021-02-03 DIAGNOSIS — I4891 Unspecified atrial fibrillation: Secondary | ICD-10-CM | POA: Diagnosis not present

## 2021-02-03 DIAGNOSIS — F32A Depression, unspecified: Secondary | ICD-10-CM | POA: Diagnosis present

## 2021-02-03 DIAGNOSIS — Z79899 Other long term (current) drug therapy: Secondary | ICD-10-CM | POA: Diagnosis not present

## 2021-02-03 DIAGNOSIS — F0390 Unspecified dementia without behavioral disturbance: Secondary | ICD-10-CM

## 2021-02-03 DIAGNOSIS — I6389 Other cerebral infarction: Secondary | ICD-10-CM

## 2021-02-03 DIAGNOSIS — Z7982 Long term (current) use of aspirin: Secondary | ICD-10-CM | POA: Diagnosis not present

## 2021-02-03 DIAGNOSIS — Z66 Do not resuscitate: Secondary | ICD-10-CM | POA: Diagnosis present

## 2021-02-03 DIAGNOSIS — R4702 Dysphasia: Secondary | ICD-10-CM | POA: Diagnosis present

## 2021-02-03 DIAGNOSIS — E1122 Type 2 diabetes mellitus with diabetic chronic kidney disease: Secondary | ICD-10-CM | POA: Diagnosis present

## 2021-02-03 DIAGNOSIS — R29700 NIHSS score 0: Secondary | ICD-10-CM | POA: Diagnosis present

## 2021-02-03 DIAGNOSIS — G4733 Obstructive sleep apnea (adult) (pediatric): Secondary | ICD-10-CM | POA: Diagnosis present

## 2021-02-03 DIAGNOSIS — Z880 Allergy status to penicillin: Secondary | ICD-10-CM | POA: Diagnosis not present

## 2021-02-03 DIAGNOSIS — N1832 Chronic kidney disease, stage 3b: Secondary | ICD-10-CM | POA: Diagnosis not present

## 2021-02-03 DIAGNOSIS — R251 Tremor, unspecified: Secondary | ICD-10-CM | POA: Diagnosis present

## 2021-02-03 DIAGNOSIS — I4819 Other persistent atrial fibrillation: Secondary | ICD-10-CM | POA: Diagnosis present

## 2021-02-03 DIAGNOSIS — I639 Cerebral infarction, unspecified: Secondary | ICD-10-CM | POA: Diagnosis present

## 2021-02-03 DIAGNOSIS — F0280 Dementia in other diseases classified elsewhere without behavioral disturbance: Secondary | ICD-10-CM | POA: Diagnosis present

## 2021-02-03 DIAGNOSIS — Z20822 Contact with and (suspected) exposure to covid-19: Secondary | ICD-10-CM | POA: Diagnosis present

## 2021-02-03 DIAGNOSIS — Z87891 Personal history of nicotine dependence: Secondary | ICD-10-CM | POA: Diagnosis not present

## 2021-02-03 LAB — GLUCOSE, CAPILLARY
Glucose-Capillary: 93 mg/dL (ref 70–99)
Glucose-Capillary: 156 mg/dL — ABNORMAL HIGH (ref 70–99)
Glucose-Capillary: 158 mg/dL — ABNORMAL HIGH (ref 70–99)
Glucose-Capillary: 134 mg/dL — ABNORMAL HIGH (ref 70–99)

## 2021-02-03 LAB — CBC
HCT: 36.8 % — ABNORMAL LOW (ref 39.0–52.0)
Hemoglobin: 12.1 g/dL — ABNORMAL LOW (ref 13.0–17.0)
MCH: 29.4 pg (ref 26.0–34.0)
MCHC: 32.9 g/dL (ref 30.0–36.0)
MCV: 89.3 fL (ref 80.0–100.0)
Platelets: 120 10*3/uL — ABNORMAL LOW (ref 150–400)
RBC: 4.12 MIL/uL — ABNORMAL LOW (ref 4.22–5.81)
RDW: 13.9 % (ref 11.5–15.5)
WBC: 3.9 10*3/uL — ABNORMAL LOW (ref 4.0–10.5)
nRBC: 0 % (ref 0.0–0.2)

## 2021-02-03 LAB — LIPID PANEL
Cholesterol: 149 mg/dL (ref 0–200)
HDL: 27 mg/dL — ABNORMAL LOW (ref >40)
LDL Cholesterol: 73 mg/dL (ref 0–99)
Total CHOL/HDL Ratio: 5.5 RATIO
Triglycerides: 245 mg/dL — ABNORMAL HIGH (ref <150)
VLDL: 49 mg/dL — ABNORMAL HIGH (ref 0–40)

## 2021-02-03 LAB — ECHOCARDIOGRAM COMPLETE
AR max vel: 2.95 cm2
AV Area VTI: 2.84 cm2
AV Area mean vel: 2.99 cm2
AV Mean grad: 3 mmHg
AV Peak grad: 7.4 mmHg
Ao pk vel: 1.36 m/s
Area-P 1/2: 8.2 cm2
Height: 75 in
MV VTI: 3.62 cm2
S' Lateral: 3.6 cm
Weight: 3908.8 oz

## 2021-02-03 LAB — TSH: TSH: 7.481 u[IU]/mL — ABNORMAL HIGH (ref 0.350–4.500)

## 2021-02-03 LAB — HEPARIN LEVEL (UNFRACTIONATED)
Heparin Unfractionated: 0.31 IU/mL (ref 0.30–0.70)
Heparin Unfractionated: 0.57 IU/mL (ref 0.30–0.70)

## 2021-02-03 MED ORDER — HYDRALAZINE HCL 20 MG/ML IJ SOLN: 10.0000 mg | Freq: Four times a day (QID) | INTRAMUSCULAR | Status: DC | PRN

## 2021-02-03 MED ORDER — RIVAROXABAN 20 MG PO TABS
20.0000 mg | ORAL_TABLET | Freq: Every day | ORAL | Status: DC
  Administered 2021-02-03: 20 mg via ORAL
  Filled 2021-02-03: qty 1

## 2021-02-03 MED ORDER — PERFLUTREN LIPID MICROSPHERE
1.0000 mL | INTRAVENOUS | Status: AC | PRN
  Administered 2021-02-03: 2 mL via INTRAVENOUS
  Filled 2021-02-03: qty 10

## 2021-02-03 NOTE — Progress Notes (Signed)
Carotid ultrasound reveals minimal to moderate amount of bilateral atherosclerotic plaque, right greater than left, not resulting in a hemodynamically significant stenosis within either internal carotid artery.  TTE is pending.   Currently on ASA and heparin infusion. Decision on specific DOAC pending Cardiology consult for the patient's newly diagnosed atrial fibrillation.    Electronically signed: Dr. Kerney Elbe
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
## 2021-02-03 NOTE — Evaluation (Signed)
Occupational Therapy Evaluation Patient Details Name: Tanner Oconnell MRN: [id number] DOB: Sep 14, 1945 Today's Date: 02/03/2021    History of Present Illness 75 y.o. male with medical history significant of HTN,  HLD, DM, OSA, dementia, and CKD, who presents with slurred speech. MRI of the brain showed an acute infarct in the right frontal operculum. Patient was also found to have new onset A fib   Clinical Impression   Upon entering the room, pt supine in bed with wife present in the room. Pt with hx of dementia and wife reports pt's cognition at baseline.Wife confirms that pt is independent at home without use of AD. She is available to provide supervision for safety. She performs IADL tasks. Pt with no significant weakness. Pt ambulates and demonstrates self care tasks with supervision this session. No LOB noted. Caregiver reports pt's mobility/self care being baseline as well. Pt returning to bed with no further questions at this time. OT did discuss secondary stroke risk with pt and family. OT to SIGN OFF as pt has no skilled acute need.     Follow Up Recommendations  Supervision/Assistance - 24 hour;No OT follow up    Equipment Recommendations  None recommended by OT       Precautions / Restrictions Precautions Precautions: Fall      Mobility Bed Mobility Overal bed mobility: Modified Independent             General bed mobility comments: HOB elevated but no physical assistance given    Transfers Overall transfer level: Modified independent Equipment used: None             General transfer comment: Mod I for sit <> stand and supervison for mobility    Balance Overall balance assessment: Modified Independent                                         ADL either performed or assessed with clinical judgement   ADL Overall ADL's : At baseline                                       General ADL Comments: supervision overall this  session.     Vision Patient Visual Report: No change from baseline              Pertinent Vitals/Pain Pain Assessment: No/denies pain     Hand Dominance Right   Extremity/Trunk Assessment Upper Extremity Assessment Upper Extremity Assessment: Overall WFL for tasks assessed   Lower Extremity Assessment Lower Extremity Assessment: Overall WFL for tasks assessed       Communication Communication Communication: No difficulties   Cognition Arousal/Alertness: Awake/alert Behavior During Therapy: WFL for tasks assessed/performed Overall Cognitive Status: Impaired/Different from baseline                                 General Comments: hx of dementia with wife reporting current cognition as being his baseline. Pt is very cooperative and he wants to go home.              Home Living Family/patient expects to be discharged to:: Private residence Living Arrangements: Spouse/significant other Available Help at Discharge: Family;Available 24 hours/day Type of Home: House Home Access: Stairs to enter [employer] of  Steps: 8 STE ( 4 steps>landing> 4 additional steps) Entrance Stairs-Rails: Right;Left Home Layout: Two level;Bed/bath upstairs;Able to live on main level with bedroom/bathroom     Bathroom Shower/Tub: Walk-in shower         Home Equipment: Shower seat - built in          Prior Functioning/Environment          Comments: Per wife, pt does not use AD at home for functional mobility or self care. He performs all these tasks himself. Wife provides supervison for safety and IADL tasks                 OT Goals(Current goals can be found in the care plan section) Acute Rehab OT Goals Patient Stated Goal: to go home OT Goal Formulation: With patient/family Time For Goal Achievement: 02/17/21 Potential to Achieve Goals: Good   AM-PAC OT "6 Clicks" Daily Activity     Outcome Measure Help from another person eating meals?:  None Help from another person taking care of personal grooming?: None Help from another person toileting, which includes using toliet, bedpan, or urinal?: None Help from another person bathing (including washing, rinsing, drying)?: None Help from another person to put on and taking off regular upper body clothing?: None Help from another person to put on and taking off regular lower body clothing?: None 6 Click Score: 24   End of Session Nurse Communication: Mobility status  Activity Tolerance: Patient tolerated treatment well Patient left: in bed;with call bell/phone within reach;with family/visitor present                   Time: 0920-0943 OT Time Calculation (min): 23 min Charges:  OT General Charges $OT Visit: 1 Visit OT Evaluation $OT Eval Low Complexity: 1 Low OT Treatments $Therapeutic Activity: 8-22 mins  Darleen Crocker, MS, OTR/L , CBIS ascom [phone]  02/03/21, 12:36 PM

## 2021-02-03 NOTE — Evaluation (Signed)
Physical Therapy Evaluation Patient Details Name: Tanner Oconnell MRN: [id number] DOB: September 22, 1945 Today's Date: 02/03/2021   History of Present Illness  Pt is a 75 y.o. male with medical history significant of HTN,  HLD, DM, OSA, dementia, and CKD, who presents with slurred speech. MRI of the brain showed an acute infarct in the right frontal operculum. Patient was also found to have new onset A fib.   Clinical Impression  Pt was pleasant and motivated to participate during the session.  Pt presented with no deficits in BUE/BLE strength, coordination, or sensation and was Ind with all functional tasks per below.  Per patient and spouse pt is at his functional baseline at this time.  No skilled PT need identified and will complete PT orders at this time but will reassess pt pending a change in status upon receipt of new PT orders.      Follow Up Recommendations No PT follow up    Equipment Recommendations  None recommended by PT    Recommendations for Other Services       Precautions / Restrictions Precautions Precautions: None Restrictions Weight Bearing Restrictions: No      Mobility  Bed Mobility Overal bed mobility: Independent                  Transfers Overall transfer level: Independent               General transfer comment: Good eccentric control and stability  Ambulation/Gait Ambulation/Gait assistance: Independent Gait Distance (Feet): 200 Feet   Gait Pattern/deviations: WFL(Within Functional Limits) Gait velocity: WNL   General Gait Details: Good stability without LOB including during start/stops and 180 deg turns  Stairs Stairs: Yes Stairs assistance: Independent Stair Management: No rails Number of Stairs: 4 General stair comments: Pt steady with good eccentric and concentric control and stability without the use of rails  Wheelchair Mobility    Modified Rankin (Stroke Patients Only)       Balance Overall balance assessment:  Independent                                           Pertinent Vitals/Pain Pain Assessment: No/denies pain    Home Living Family/patient expects to be discharged to:: Private residence Living Arrangements: Spouse/significant other Available Help at Discharge: Family;Available 24 hours/day Type of Home: House Home Access: Stairs to enter Entrance Stairs-Rails: Right;Left Entrance Stairs-Number of Steps: 8 STE ( 4 steps>landing> 4 additional steps) Home Layout: Two level;Bed/bath upstairs;Able to live on main level with bedroom/bathroom Home Equipment: Shower seat - built in      Prior Function           Comments: Per wife, pt does not use AD at home for functional mobility or self care. He performs all these tasks himself. Wife provides supervison for safety and IADL tasks. Pt active and still does yard work.     Hand Dominance   Dominant Hand: Right    Extremity/Trunk Assessment   Upper Extremity Assessment Upper Extremity Assessment: Overall WFL for tasks assessed    Lower Extremity Assessment Lower Extremity Assessment: Overall WFL for tasks assessed       Communication   Communication: No difficulties  Cognition Arousal/Alertness: Awake/alert Behavior During Therapy: WFL for tasks assessed/performed Overall Cognitive Status: History of cognitive impairments - at baseline  General Comments: Pt with history of dementia, at baseline per spouse      General Comments General comments (skin integrity, edema, etc.): SLS time >/= 8-10 sec to BLEs without LOB    Exercises     Assessment/Plan    PT Assessment Patent does not need any further PT services  PT Problem List         PT Treatment Interventions      PT Goals (Current goals can be found in the Care Plan section)  Acute Rehab PT Goals PT Goal Formulation: All assessment and education complete, DC therapy    Frequency      Barriers to discharge        Co-evaluation               AM-PAC PT "6 Clicks" Mobility  Outcome Measure Help needed turning from your back to your side while in a flat bed without using bedrails?: None Help needed moving from lying on your back to sitting on the side of a flat bed without using bedrails?: None Help needed moving to and from a bed to a chair (including a wheelchair)?: None Help needed standing up from a chair using your arms (e.g., wheelchair or bedside chair)?: None Help needed to walk in hospital room?: None Help needed climbing 3-5 steps with a railing? : None 6 Click Score: 24    End of Session Equipment Utilized During Treatment: Gait belt Activity Tolerance: Patient tolerated treatment well Patient left: in bed;with call bell/phone within reach;with family/visitor present Nurse Communication: Mobility status PT Visit Diagnosis: Other symptoms and signs involving the nervous system (R29.898)    Time: 5859-2924 PT Time Calculation (min) (ACUTE ONLY): 21 min   Charges:   PT Evaluation $PT Eval Low Complexity: 1 Low          D. Royetta Asal PT, DPT 02/03/21, 4:07 PM

## 2021-02-03 NOTE — Consult Note (Signed)
ANTICOAGULATION CONSULT NOTE - Follow Up Consult  Pharmacy Consult for heparin Indication: chest pain/ACS  Allergies  Allergen Reactions   Penicillins Rash    Unknown reaction Red bumps.    Patient Measurements: Height: 6\' 3"  (190.5 cm) Weight: 110.8 kg (244 lb 4.8 oz) IBW/kg (Calculated) : 84.5 Heparin Dosing Weight: 104.9 kg  Vital Signs: Temp: 98 F (36.7 C) (07/01 0756) Temp Source: Oral (07/01 0756) BP: 167/69 (07/01 0756) Pulse Rate: 52 (07/01 0756)  Labs: Recent Labs    02/02/21 1331 02/02/21 1524 02/02/21 1640 02/02/21 1822 02/02/21 2127 02/03/21 0105  HGB 13.2  --   --   --   --   --   HCT 39.0  --   --   --   --   --   PLT 148*  --   --   --   --   --   APTT  --   --  40*  --   --   --   LABPROT  --   --  14.2  --   --   --   INR  --   --  1.1  --   --   --   HEPARINUNFRC  --   --   --   --   --  0.31  CREATININE 1.24  --   --   --   --   --   TROPONINIHS 203* 191*  --  187* 207*  --      Estimated Creatinine Clearance: 67 mL/min (by C-G formula based on SCr of 1.24 mg/dL).   Medications:  Aspirin 81 mg daily PTA  Assessment:  75 y.o. male with medical history significant of HTN,  HLD, DM, OSA, dementia, and CKD, who presents with slurred speech. MRI of the brain showed an acute infarct in the right frontal operculum. Patient was also found to have new onset A fib. Pharmacy has been consulted for heparin dosing.   7/01: Cards c/s'd and discussing potential for DOAC at discharge.  Heparin Dosing Weight: 104.9 kg  Date Time aPTT/HL Rate/Comment 0701 0105 0.31   Therapeutic but borderline; 1400>1500 un/hr    Baseline Labs: aPTT - 40s INR - 1.1 Hgb - 13.2 Plts - 148   Goal of Therapy:  Heparin level 0.3-0.7 units/ml Monitor platelets by anticoagulation protocol: Yes   Plan:  Therapeutic @0 .31 (0701 0105), but borderline (0.3-0.7).  Will increase heparin infusion rate to 1500 units/hr to avoid time outside of therapeutic range. Check  anti-Xa level in 8 hours and daily while on heparin Continue to monitor H&H and platelets  Lorna Dibble, PharmD 02/03/2021,8:42 AM

## 2021-02-03 NOTE — Progress Notes (Signed)
[facility] [place] NAME: Tanner Oconnell    MR#:  [id number]  DATE OF BIRTH:  Jan 31, 1946  SUBJECTIVE:  Wife at bedside. Patient denies any complaints. His requesting wants to go home. Apparently was brought in with speech notice by wife. Improved. Tolerating PO diet. Ambulated with occupational therapy earlier. REVIEW OF SYSTEMS:   Review of Systems  Constitutional:  Negative for chills, fever and weight loss.  HENT:  Negative for ear discharge, ear pain and nosebleeds.   Eyes:  Negative for blurred vision, pain and discharge.  Respiratory:  Negative for sputum production, shortness of breath, wheezing and stridor.   Cardiovascular:  Negative for chest pain, palpitations, orthopnea and PND.  Gastrointestinal:  Negative for abdominal pain, diarrhea, nausea and vomiting.  Genitourinary:  Negative for frequency and urgency.  Musculoskeletal:  Negative for back pain and joint pain.  Neurological:  Positive for weakness. Negative for sensory change, speech change and focal weakness.  Psychiatric/Behavioral:  Negative for depression and hallucinations. The patient is not nervous/anxious.   Tolerating Diet:yes Tolerating PT:yes  DRUG ALLERGIES:   Allergies  Allergen Reactions  . Penicillins Rash    Unknown reaction Red bumps.    VITALS:  Blood pressure (!) 175/97, pulse 84, temperature 98 F (36.7 C), temperature source Oral, resp. rate (!) 24, height 6\' 3"  (1.905 m), weight 110.8 kg, SpO2 95 %.  PHYSICAL EXAMINATION:   Physical Exam  GENERAL:  75 y.o.-year-old patient lying in the bed with no acute distress.  HEENT: Head atraumatic, normocephalic. Oropharynx and nasopharynx clear.  LUNGS: Normal breath sounds bilaterally, no wheezing, rales, rhonchi. No use of accessory muscles of respiration.  CARDIOVASCULAR: S1, S2 normal. No murmurs, rubs, or gallops.  ABDOMEN: Soft, nontender, nondistended. Bowel sounds present. No organomegaly or  mass.  EXTREMITIES: No cyanosis, clubbing or edema b/l.    NEUROLOGIC: Cranial nerves II through XII are intact. No focal Motor or sensory deficits b/l. Generalized weakness PSYCHIATRIC:  patient is alert and oriented x 2 SKIN: No obvious rash, lesion, or ulcer.   LABORATORY PANEL:  CBC Recent Labs  Lab 02/03/21 0105  WBC 3.9*  HGB 12.1*  HCT 36.8*  PLT 120*    Chemistries  Recent Labs  Lab 02/02/21 1331  NA 141  K 4.4  CL 107  CO2 28  GLUCOSE 128*  BUN 22  CREATININE 1.24  CALCIUM 9.5   Cardiac Enzymes No results for input(s): TROPONINI in the last 168 hours. RADIOLOGY:  CT Head Wo Contrast  Result Date: 02/02/2021 CLINICAL DATA:  Acute neuro deficit.  Slurred speech. EXAM: CT HEAD WITHOUT CONTRAST TECHNIQUE: Contiguous axial images were obtained from the base of the skull through the vertex without intravenous contrast. COMPARISON:  MRI head 10/07/2018 FINDINGS: Brain: Moderate atrophy with mild ventricular enlargement. Mild white matter changes. Negative for acute infarct, hemorrhage, mass. Vascular: Negative for hyperdense vessel Skull: Negative Sinuses/Orbits: Negative Other: None IMPRESSION: No acute abnormality. Generalized atrophy and mild chronic microvascular ischemic change in the white matter. Electronically Signed   By: Franchot Gallo M.D.   On: 02/02/2021 15:15   MR ANGIO HEAD WO CONTRAST  Result Date: 02/02/2021 CLINICAL DATA:  TIA.  Slurred speech EXAM: MRI HEAD WITHOUT CONTRAST MRA HEAD WITHOUT CONTRAST TECHNIQUE: Multiplanar, multi-echo pulse sequences of the brain and surrounding structures were acquired without intravenous contrast. Angiographic images of the Circle of Willis were acquired using MRA technique without intravenous contrast. COMPARISON:  CT head 02/02/2021 FINDINGS:  MRI HEAD FINDINGS Brain: 3 mm diffusion positivity in the right frontal operculum compatible with acute infarct. No other areas of restricted diffusion Generalized atrophy without  hydrocephalus. Mild white matter changes consistent with chronic microvascular ischemia. Negative for hemorrhage or mass. Vascular: Normal arterial flow voids. Skull and upper cervical spine: Negative Sinuses/Orbits: With mucosal edema left contraction maxillary. Bilateral sinus cataract extraction Other: None MRA HEAD FINDINGS Anterior circulation: Internal carotid artery widely patent bilaterally. Hypoplastic left A1 segment. Both anterior cerebral arteries are patent supplied primarily from the right. Middle cerebral arteries patent bilaterally. Severe focal stenosis in posterior right M2 branch. Moderate stenosis left anterior M2 branch. No vascular malformation or large vessel occlusion. Posterior circulation: Left vertebral artery dominant. Both vertebral arteries patent to the basilar. PICA not included on the study. AICA patent bilaterally. Basilar widely patent. Superior cerebellar and posterior cerebral arteries patent bilaterally. Mild stenosis in the left P2 segment. No vascular malformation Anatomic variants: None IMPRESSION: Small 3 mm area of acute infarct in the right frontal operculum. Atrophy and mild chronic microvascular ischemic change Severe focal stenosis posterior right M2 branch. Moderate stenosis left anterior M2 branch. Mild stenosis left P2 branch. Electronically Signed   By: Franchot Gallo M.D.   On: 02/02/2021 17:04   MR BRAIN WO CONTRAST  Result Date: 02/02/2021 CLINICAL DATA:  TIA.  Slurred speech EXAM: MRI HEAD WITHOUT CONTRAST MRA HEAD WITHOUT CONTRAST TECHNIQUE: Multiplanar, multi-echo pulse sequences of the brain and surrounding structures were acquired without intravenous contrast. Angiographic images of the Circle of Willis were acquired using MRA technique without intravenous contrast. COMPARISON:  CT head 02/02/2021 FINDINGS: MRI HEAD FINDINGS Brain: 3 mm diffusion positivity in the right frontal operculum compatible with acute infarct. No other areas of restricted  diffusion Generalized atrophy without hydrocephalus. Mild white matter changes consistent with chronic microvascular ischemia. Negative for hemorrhage or mass. Vascular: Normal arterial flow voids. Skull and upper cervical spine: Negative Sinuses/Orbits: With mucosal edema left contraction maxillary. Bilateral sinus cataract extraction Other: None MRA HEAD FINDINGS Anterior circulation: Internal carotid artery widely patent bilaterally. Hypoplastic left A1 segment. Both anterior cerebral arteries are patent supplied primarily from the right. Middle cerebral arteries patent bilaterally. Severe focal stenosis in posterior right M2 branch. Moderate stenosis left anterior M2 branch. No vascular malformation or large vessel occlusion. Posterior circulation: Left vertebral artery dominant. Both vertebral arteries patent to the basilar. PICA not included on the study. AICA patent bilaterally. Basilar widely patent. Superior cerebellar and posterior cerebral arteries patent bilaterally. Mild stenosis in the left P2 segment. No vascular malformation Anatomic variants: None IMPRESSION: Small 3 mm area of acute infarct in the right frontal operculum. Atrophy and mild chronic microvascular ischemic change Severe focal stenosis posterior right M2 branch. Moderate stenosis left anterior M2 branch. Mild stenosis left P2 branch. Electronically Signed   By: Franchot Gallo M.D.   On: 02/02/2021 17:04   US Carotid Bilateral  Result Date: 02/03/2021 CLINICAL DATA:  Stroke.  History of hypertension and diabetes. EXAM: BILATERAL CAROTID DUPLEX ULTRASOUND TECHNIQUE: Pearline Cables scale imaging, color Doppler and duplex ultrasound were performed of bilateral carotid and vertebral arteries in the neck. COMPARISON:  None. FINDINGS: Criteria: Quantification of carotid stenosis is based on velocity parameters that correlate the residual internal carotid diameter with NASCET-based stenosis levels, using the diameter of the distal internal carotid  lumen as the denominator for stenosis measurement. The following velocity measurements were obtained: RIGHT ICA: 57/10 cm/sec CCA: 94/7 cm/sec SYSTOLIC ICA/CCA RATIO:  0.8 ECA: 74  cm/sec LEFT ICA: 46/15 cm/sec CCA: 30/8 cm/sec SYSTOLIC ICA/CCA RATIO:  0.6 ECA: 97 cm/sec RIGHT CAROTID ARTERY: There is a minimal moderate amount of eccentric echogenic plaque within the right carotid bulb (image 15), extending to involve the origin and proximal aspects of the right internal carotid artery (image 22), not resulting in elevated peak systolic velocities within the interrogated course of the right internal carotid artery to suggest a hemodynamically significant stenosis. RIGHT VERTEBRAL ARTERY:  Antegrade flow LEFT CAROTID ARTERY: There is a minimal amount of eccentric echogenic plaque within the left carotid bulb (image 47), extending to involve the origin and proximal aspects of the left internal carotid artery (image 54), not resulting in elevated peak systolic velocities within the interrogated course of the left internal carotid artery to suggest a hemodynamically significant stenosis. LEFT VERTEBRAL ARTERY:  Antegrade flow IMPRESSION: Minimal to moderate amount of bilateral atherosclerotic plaque, right greater than left, not resulting in a hemodynamically significant stenosis within either internal carotid artery. Electronically Signed   By: Sandi Mariscal M.D.   On: 02/03/2021 08:21   ASSESSMENT AND PLAN:   BLANTON KARDELL is a 75 y.o. male with medical history significant of HTN,  HLD, DM, hypothyroidism, depression, OSA, dementia, CKD-3A, who presents with slurred speech.   Per his wife, pt had 1 episode of slurred speech at about 1 PM, which has lasted for about 10-15 minutes.  Patient does not have unilateral numbness , tingling, weakness in extremities.  No facial droop.  Patient has history of dementia and his mental status is at his baseline per his wife.  Stroke [facility]):  acute infarct right frontal  lobe --MRI of brain and MRA of head showed a small 3 mm area of acute infarct in the right frontal operculum. Severe focal stenosis posterior right M2 branch. Moderate stenosis left anterior M2 branch. Mild stenosis left P2 branch. -- Dr. Cheral Marker of neuro is consulted-- recommends to consider oral anticoagulation given new onset a fib and stroke. -- patient was on IV heparin drip-- will transition to oral anticoagulation per cardiology recommendation --Carotid ultrasound reveals minimal to moderate amount of bilateral atherosclerotic plaque, right greater than left, not resulting in a hemodynamically significant stenosis within either internal carotid artery. - Continue ASA -  ipitor 40 mg daily - PT/OT consult noted--no PT needs  new onset atrial flutter/fibrillation -- patient came in with heart rate of 180 now remains in the 50-to 60 -- [facility] cardiology input appreciated.pt's CHADSVAC2 is 6 and recommend oral anticoagulation. Will start patient on Xarelto per recommendation   Dementia ([place]) Donepezil and Namenda   Type II diabetes mellitus with renal manifestations ([place]): No A1c available.  Blood sugar 128.  Patient is taking metformin and Amaryl -Sliding scale insulin   Hyperlipidemia --Lipitor 40 mg daily -Continue fenofibrate   Hypothyroidism -Synthroid   Hypertension -Hold amlodipine and Hyzaar to allow permissive hypertension -IV hydralazine as needed -Continue propranolol due to A. Fib   Elevated troponin: Troponin level 203, 191.  No chest pain.  Likely due to demand ischemia -Trend troponin -Aspirin, Lipitor -Follow-up A1c, FLP    Depression -Continue home medications, Seroquel, Zoloft   CKD (chronic kidney disease), stage IIIa: Stable      DVT ppx: on IV Heparin     Code Status: DNR per his wife Family Communication:  Yes, patient's husband at bed side Disposition Plan:  Anticipate discharge back to previous environment Consults called:   [facility] cardiology  for consultation.  Dr. Cheral Marker  of neurology is consulted. Admission status and Level of care: Progressive Cardiac:     Status is: inpatient      Dispo: The patient is from: Home              Anticipated d/c is to: [facility] tomorrow              Patient currently is not medically stable to d/c.              Difficult to place patient No         TOTAL TIME TAKING CARE OF THIS PATIENT: 25 minutes.  >50% time spent on counselling and coordination of care  Note: This dictation was prepared with Dragon dictation along with smaller phrase technology. Any transcriptional errors that result from this process are unintentional.  Fritzi Mandes M.D    [facility]   CC: Primary care physician; Tracie Harrier, MD Patient ID: Tanner Oconnell, male   DOB: January 28, 1946, 75 y.o.   MRN: [id number]

## 2021-02-03 NOTE — Plan of Care (Signed)
  Problem: Clinical Measurements: Goal: Ability to maintain clinical measurements within normal limits will improve Outcome: Progressing   Problem: Clinical Measurements: Goal: Will remain free from infection Outcome: Progressing   

## 2021-02-03 NOTE — Progress Notes (Signed)
SLP Cancellation Note  Patient Details Name: LYMAN BALINGIT MRN: [id number] DOB: [birth year]   Cancelled treatment:       Reason Eval/Treat Not Completed: SLP screened, no needs identified, will sign off  Chart reviewed and pt's wife also reports that pt is back at baseline.   Makaylia Hewett B. Rutherford Nail M.S., CCC-SLP, [place] Office [phone]  Stormy Fabian 02/03/2021, 12:16 PM

## 2021-02-03 NOTE — Progress Notes (Signed)
*  PRELIMINARY RESULTS* Echocardiogram 2D Echocardiogram has been performed.  Tanner Oconnell 02/03/2021, 11:13 AM

## 2021-02-04 DIAGNOSIS — N1832 Chronic kidney disease, stage 3b: Secondary | ICD-10-CM

## 2021-02-04 LAB — CBC
HCT: 37.5 % — ABNORMAL LOW (ref 39.0–52.0)
Hemoglobin: 12.9 g/dL — ABNORMAL LOW (ref 13.0–17.0)
MCH: 29.7 pg (ref 26.0–34.0)
MCHC: 34.4 g/dL (ref 30.0–36.0)
MCV: 86.4 fL (ref 80.0–100.0)
Platelets: 140 10*3/uL — ABNORMAL LOW (ref 150–400)
RBC: 4.34 MIL/uL (ref 4.22–5.81)
RDW: 13.7 % (ref 11.5–15.5)
WBC: 4.7 10*3/uL (ref 4.0–10.5)
nRBC: 0 % (ref 0.0–0.2)

## 2021-02-04 LAB — GLUCOSE, CAPILLARY: Glucose-Capillary: 120 mg/dL — ABNORMAL HIGH (ref 70–99)

## 2021-02-04 LAB — HEMOGLOBIN A1C
Hgb A1c MFr Bld: 6.2 % — ABNORMAL HIGH (ref 4.8–5.6)
Mean Plasma Glucose: 131 mg/dL

## 2021-02-04 MED ORDER — RIVAROXABAN 20 MG PO TABS
20.0000 mg | ORAL_TABLET | Freq: Every day | ORAL | 1 refills | Status: DC
Start: 2021-02-04 — End: 2021-04-06

## 2021-02-04 MED ORDER — ATORVASTATIN CALCIUM 40 MG PO TABS: 40.0000 mg | ORAL_TABLET | Freq: Every day | ORAL | 0 refills | Status: DC

## 2021-02-04 NOTE — Discharge Summary (Signed)
[facility] NAME: Tanner Oconnell    MR#:  [id number]  DATE OF BIRTH:  [birth year]  DATE OF ADMISSION:  02/02/2021 ADMITTING PHYSICIAN: Fritzi Mandes, MD  DATE OF DISCHARGE: 02/04/2021 PRIMARY CARE PHYSICIAN: Tracie Harrier, MD    ADMISSION DIAGNOSIS:  TIA (transient ischemic attack) [G45.9] Stroke ([place]) [I63.9] Elevated troponin [R77.8] Atrial fibrillation, new onset ([place]) [I48.91] Acute CVA (cerebrovascular accident) ([place]) [I63.9]  DISCHARGE DIAGNOSIS:  Acute CVA right frontal operculum Atrial flutter/fib--new now on po DOAC SECONDARY DIAGNOSIS:   Past Medical History:  Diagnosis Date  . Dementia ([place])   . Diabetes mellitus without complication ([place])   . History of kidney stones   . Hyperlipidemia   . Hypertension   . Hypothyroidism   . Obstructive sleep apnea    refuses to wear cpap  . Thrombocytopenia ([place])   . Thyroid disease    Hypothyroidism    HOSPITAL COURSE:   Tanner Oconnell is a 75 y.o. male with medical history significant of HTN,  HLD, DM, hypothyroidism, depression, OSA, dementia, CKD-3A, who presents with slurred speech.   Per his wife, pt had 1 episode of slurred speech at about 1 PM, which has lasted for about 10-15 minutes.  Patient does not have unilateral numbness , tingling, weakness in extremities.  No facial droop.  Patient has history of dementia and his mental status is at his baseline per his wife.   Stroke [facility]):  Acute infarct right frontal operculum --MRI of brain and MRA of head showed a small 3 mm area of acute infarct in the right frontal operculum. Severe focal stenosis posterior right M2 branch. Moderate stenosis left anterior M2 branch. Mild stenosis left P2 branch. -- Dr. Cheral Marker of neuro is consulted-- recommends to consider oral anticoagulation given new onset a fib and stroke. -- patient was on IV heparin drip-- will transition to oral anticoagulation per cardiology  recommendation --Carotid ultrasound reveals minimal to moderate amount of bilateral atherosclerotic plaque, right greater than left, not resulting in a hemodynamically significant stenosis within either internal carotid artery.  - Lipitor 40 mg daily - PT/OT consult noted--no PT needs   new onset atrial flutter/fibrillation -- patient came in with heart rate of 180 now remains in the 50-to 60 -- [facility] cardiology input appreciated.pt's CHADSVAC2 is 6 and recommend oral anticoagulation. patient  now on Xarelto per recommendation --HR in 40's during sleep. Else HR 50-60. Pt asymptomatic. On chronic propranolol for tremors. Will cont it since pt is stable.   Dementia ([place]) Donepezil and Namenda   Type II diabetes mellitus with renal manifestations ([place]): No A1c available.  Blood sugar 128.   --Patient is taking metformin and Amaryl -Sliding scale insulin   Hyperlipidemia --Lipitor 40 mg daily -Continue fenofibrate   Hypothyroidism -Synthroid   Hypertension -resumed home BP meds -IV hydralazine as needed -Continue propranolol due to A. Fib   Elevated troponin: Troponin level 203, 191.  No chest pain.  Likely due to demand ischemia    Depression -Continue Seroquel, Zoloft   CKD (chronic kidney disease), stage IIIa: Stable       DVT ppx: on IV Heparin     Code Status: DNR per his wife Family Communication:  Yes, patient's husband at bed side Disposition Plan:  Anticipate discharge back to previous environment Consults called:   [facility] cardiology for consultation.  Dr. Cheral Marker of neurology is consulted. Admission status and Level of care: Progressive Cardiac:  Status is: inpatient       Dispo: The patient is from: Home              Anticipated d/c is to: Home today              Patient currently is  medically stable to d/c.              Difficult to place patient No     CONSULTS OBTAINED:  Treatment Team:  Minna Merritts, MD Wellington Hampshire, MD  DRUG  ALLERGIES:   Allergies  Allergen Reactions  . Penicillins Rash    Unknown reaction Red bumps.    DISCHARGE MEDICATIONS:   Allergies as of 02/04/2021       Reactions   Penicillins Rash   Unknown reaction Red bumps.        Medication List     STOP taking these medications    aspirin 81 MG EC tablet   HYDROcodone-acetaminophen 5-325 MG tablet Commonly known as: NORCO/VICODIN   tamsulosin 0.4 MG Caps capsule Commonly known as: FLOMAX       TAKE these medications    amLODipine 5 MG tablet Commonly known as: NORVASC TAKE 1 TABLET ONCE DAILY   ascorbic acid 1000 MG tablet Commonly known as: VITAMIN C Take 1,000 mg by mouth daily.   atorvastatin 40 MG tablet Commonly known as: LIPITOR Take 1 tablet (40 mg total) by mouth daily.   CALCIUM-VITAMIN D PO Take by mouth.   docusate sodium 100 MG capsule Commonly known as: COLACE Take 1 capsule (100 mg total) by mouth 2 (two) times daily. For constipation.   donepezil 10 MG tablet Commonly known as: ARICEPT Take 10 mg by mouth daily.   fenofibrate 145 MG tablet Commonly known as: TRICOR Take 1 tablet by mouth daily. What changed: Another medication with the same name was removed. Continue taking this medication, and follow the directions you see here.   Fish Oil 1000 MG Caps Take by mouth.   gabapentin 100 MG capsule Commonly known as: NEURONTIN Take 1 capsule by mouth 2 (two) times daily.   glimepiride 1 MG tablet Commonly known as: AMARYL Take 1 tablet by mouth in the morning. What changed: Another medication with the same name was removed. Continue taking this medication, and follow the directions you see here.   glimepiride 2 MG tablet Commonly known as: AMARYL Take 2 mg by mouth at bedtime. What changed: Another medication with the same name was removed. Continue taking this medication, and follow the directions you see here.   levothyroxine 25 MCG tablet Commonly known as: SYNTHROID Take 25  mcg by mouth daily.   losartan-hydrochlorothiazide 100-25 MG tablet Commonly known as: HYZAAR Take 1 tablet by mouth daily.   memantine 10 MG tablet Commonly known as: NAMENDA Take 10 mg by mouth 2 (two) times daily.   metFORMIN 1000 MG tablet Commonly known as: GLUCOPHAGE Take 1,000 mg by mouth 2 (two) times daily.   Multi-Vitamin tablet Take by mouth.   omeprazole 20 MG capsule Commonly known as: PRILOSEC Take 20 mg by mouth daily.   propranolol 10 MG tablet Commonly known as: INDERAL Take 10 mg by mouth 2 (two) times daily.   QUEtiapine 25 MG tablet Commonly known as: SEROQUEL Take 25 mg by mouth at bedtime.   rivaroxaban 20 MG Tabs tablet Commonly known as: XARELTO Take 1 tablet (20 mg total) by mouth daily with supper.   sertraline 100 MG tablet Commonly known  as: ZOLOFT Take 100 mg by mouth daily.   vitamin B-12 1000 MCG tablet Commonly known as: CYANOCOBALAMIN Take 1,000 mcg by mouth daily.        If you experience worsening of your admission symptoms, develop shortness of breath, life threatening emergency, suicidal or homicidal thoughts you must seek medical attention immediately by calling 911 or calling your MD immediately  if symptoms less severe.  You Must read complete instructions/literature along with all the possible adverse reactions/side effects for all the Medicines you take and that have been prescribed to you. Take any new Medicines after you have completely understood and accept all the possible adverse reactions/side effects.   Please note  You were cared for by a hospitalist during your hospital stay. If you have any questions about your discharge medications or the care you received while you were in the hospital after you are discharged, you can call the unit and asked to speak with the hospitalist on call if the hospitalist that took care of you is not available. Once you are discharged, your primary care physician will handle any further  medical issues. Please note that NO REFILLS for any discharge medications will be authorized once you are discharged, as it is imperative that you return to your primary care physician (or establish a relationship with a primary care physician if you do not have one) for your aftercare needs so that they can reassess your need for medications and monitor your lab values. Today   SUBJECTIVE   No new complaints  VITAL SIGNS:  Blood pressure (!) 149/70, pulse (!) 42, temperature 97.8 F (36.6 C), resp. rate 18, height 6\' 3"  (1.905 m), weight 110.8 kg, SpO2 97 %.  I/O:   Intake/Output Summary (Last 24 hours) at 02/04/2021 1001 Last data filed at 02/03/2021 1700 Gross per 24 hour  Intake 338.78 ml  Output 225 ml  Net 113.78 ml    PHYSICAL EXAMINATION:  GENERAL:  75 y.o.-year-old patient lying in the bed with no acute distress.  LUNGS: Normal breath sounds bilaterally, no wheezing, rales,rhonchi or crepitation. No use of accessory muscles of respiration.  CARDIOVASCULAR: S1, S2 normal. No murmurs, rubs, or gallops.  ABDOMEN: Soft, non-tender, non-distended. Bowel sounds present. No organomegaly or mass.  EXTREMITIES: No pedal edema, cyanosis, or clubbing.  NEUROLOGIC: Cranial nerves II through XII are intact. Muscle strength 5/5 in all extremities. Sensation intact. Gait not checked. No dysarthria PSYCHIATRIC:  patient is alert and oriented x 2  SKIN: No obvious rash, lesion, or ulcer.   DATA REVIEW:   CBC  Recent Labs  Lab 02/04/21 0615  WBC 4.7  HGB 12.9*  HCT 37.5*  PLT 140*    Chemistries  Recent Labs  Lab 02/02/21 1331  NA 141  K 4.4  CL 107  CO2 28  GLUCOSE 128*  BUN 22  CREATININE 1.24  CALCIUM 9.5    Microbiology Results   Recent Results (from the past 240 hour(s))  Resp Panel by RT-PCR (Flu A&B, Covid)     Status: None   Collection Time: 02/02/21  3:24 PM   Specimen: Nasopharyngeal(NP) swabs in vial transport medium  Result Value Ref Range Status   SARS  Coronavirus 2 by RT PCR NEGATIVE NEGATIVE Final    Comment: (NOTE) SARS-CoV-2 target nucleic acids are NOT DETECTED.  The SARS-CoV-2 RNA is generally detectable in upper respiratory specimens during the acute phase of infection. The lowest concentration of SARS-CoV-2 viral copies this assay can detect is 138  copies/mL. A negative result does not preclude SARS-Cov-2 infection and should not be used as the sole basis for treatment or other patient management decisions. A negative result may occur with  improper specimen collection/handling, submission of specimen other than nasopharyngeal swab, presence of viral mutation(s) within the areas targeted by this assay, and inadequate number of viral copies(<138 copies/mL). A negative result must be combined with clinical observations, patient history, and epidemiological information. The expected result is Negative.  Fact Sheet for Patients:  EntrepreneurPulse.com.au  Fact Sheet for Healthcare Providers:  IncredibleEmployment.be  This test is no t yet approved or cleared by the [place] FDA and  has been authorized for detection and/or diagnosis of SARS-CoV-2 by FDA under an Emergency Use Authorization (EUA). This EUA will remain  in effect (meaning this test can be used) for the duration of the COVID-19 declaration under Section 564(b)(1) of the Act, 21 U.S.C.section 360bbb-3(b)(1), unless the authorization is terminated  or revoked sooner.       Influenza A by PCR NEGATIVE NEGATIVE Final   Influenza B by PCR NEGATIVE NEGATIVE Final    Comment: (NOTE) The Xpert Xpress SARS-CoV-2/FLU/RSV plus assay is intended as an aid in the diagnosis of influenza from Nasopharyngeal swab specimens and should not be used as a sole basis for treatment. Nasal washings and aspirates are unacceptable for Xpert Xpress SARS-CoV-2/FLU/RSV testing.  Fact Sheet for  Patients: EntrepreneurPulse.com.au  Fact Sheet for Healthcare Providers: IncredibleEmployment.be  This test is not yet approved or cleared by the [place] FDA and has been authorized for detection and/or diagnosis of SARS-CoV-2 by FDA under an Emergency Use Authorization (EUA). This EUA will remain in effect (meaning this test can be used) for the duration of the COVID-19 declaration under Section 564(b)(1) of the Act, 21 U.S.C. section 360bbb-3(b)(1), unless the authorization is terminated or revoked.  Performed at [facility], [address] [place], [place] [address]     RADIOLOGY:  CT Head Wo Contrast  Result Date: 02/02/2021 CLINICAL DATA:  Acute neuro deficit.  Slurred speech. EXAM: CT HEAD WITHOUT CONTRAST TECHNIQUE: Contiguous axial images were obtained from the base of the skull through the vertex without intravenous contrast. COMPARISON:  MRI head 10/07/2018 FINDINGS: Brain: Moderate atrophy with mild ventricular enlargement. Mild white matter changes. Negative for acute infarct, hemorrhage, mass. Vascular: Negative for hyperdense vessel Skull: Negative Sinuses/Orbits: Negative Other: None IMPRESSION: No acute abnormality. Generalized atrophy and mild chronic microvascular ischemic change in the white matter. Electronically Signed   By: Franchot Gallo M.D.   On: 02/02/2021 15:15   MR ANGIO HEAD WO CONTRAST  Result Date: 02/02/2021 CLINICAL DATA:  TIA.  Slurred speech EXAM: MRI HEAD WITHOUT CONTRAST MRA HEAD WITHOUT CONTRAST TECHNIQUE: Multiplanar, multi-echo pulse sequences of the brain and surrounding structures were acquired without intravenous contrast. Angiographic images of the Circle of Willis were acquired using MRA technique without intravenous contrast. COMPARISON:  CT head 02/02/2021 FINDINGS: MRI HEAD FINDINGS Brain: 3 mm diffusion positivity in the right frontal operculum compatible with acute infarct. No other areas  of restricted diffusion Generalized atrophy without hydrocephalus. Mild white matter changes consistent with chronic microvascular ischemia. Negative for hemorrhage or mass. Vascular: Normal arterial flow voids. Skull and upper cervical spine: Negative Sinuses/Orbits: With mucosal edema left contraction maxillary. Bilateral sinus cataract extraction Other: None MRA HEAD FINDINGS Anterior circulation: Internal carotid artery widely patent bilaterally. Hypoplastic left A1 segment. Both anterior cerebral arteries are patent supplied primarily from the right. Middle cerebral arteries patent  bilaterally. Severe focal stenosis in posterior right M2 branch. Moderate stenosis left anterior M2 branch. No vascular malformation or large vessel occlusion. Posterior circulation: Left vertebral artery dominant. Both vertebral arteries patent to the basilar. PICA not included on the study. AICA patent bilaterally. Basilar widely patent. Superior cerebellar and posterior cerebral arteries patent bilaterally. Mild stenosis in the left P2 segment. No vascular malformation Anatomic variants: None IMPRESSION: Small 3 mm area of acute infarct in the right frontal operculum. Atrophy and mild chronic microvascular ischemic change Severe focal stenosis posterior right M2 branch. Moderate stenosis left anterior M2 branch. Mild stenosis left P2 branch. Electronically Signed   By: Franchot Gallo M.D.   On: 02/02/2021 17:04   MR BRAIN WO CONTRAST  Result Date: 02/02/2021 CLINICAL DATA:  TIA.  Slurred speech EXAM: MRI HEAD WITHOUT CONTRAST MRA HEAD WITHOUT CONTRAST TECHNIQUE: Multiplanar, multi-echo pulse sequences of the brain and surrounding structures were acquired without intravenous contrast. Angiographic images of the Circle of Willis were acquired using MRA technique without intravenous contrast. COMPARISON:  CT head 02/02/2021 FINDINGS: MRI HEAD FINDINGS Brain: 3 mm diffusion positivity in the right frontal operculum compatible with  acute infarct. No other areas of restricted diffusion Generalized atrophy without hydrocephalus. Mild white matter changes consistent with chronic microvascular ischemia. Negative for hemorrhage or mass. Vascular: Normal arterial flow voids. Skull and upper cervical spine: Negative Sinuses/Orbits: With mucosal edema left contraction maxillary. Bilateral sinus cataract extraction Other: None MRA HEAD FINDINGS Anterior circulation: Internal carotid artery widely patent bilaterally. Hypoplastic left A1 segment. Both anterior cerebral arteries are patent supplied primarily from the right. Middle cerebral arteries patent bilaterally. Severe focal stenosis in posterior right M2 branch. Moderate stenosis left anterior M2 branch. No vascular malformation or large vessel occlusion. Posterior circulation: Left vertebral artery dominant. Both vertebral arteries patent to the basilar. PICA not included on the study. AICA patent bilaterally. Basilar widely patent. Superior cerebellar and posterior cerebral arteries patent bilaterally. Mild stenosis in the left P2 segment. No vascular malformation Anatomic variants: None IMPRESSION: Small 3 mm area of acute infarct in the right frontal operculum. Atrophy and mild chronic microvascular ischemic change Severe focal stenosis posterior right M2 branch. Moderate stenosis left anterior M2 branch. Mild stenosis left P2 branch. Electronically Signed   By: Franchot Gallo M.D.   On: 02/02/2021 17:04   US Carotid Bilateral  Result Date: 02/03/2021 CLINICAL DATA:  Stroke.  History of hypertension and diabetes. EXAM: BILATERAL CAROTID DUPLEX ULTRASOUND TECHNIQUE: Pearline Cables scale imaging, color Doppler and duplex ultrasound were performed of bilateral carotid and vertebral arteries in the neck. COMPARISON:  None. FINDINGS: Criteria: Quantification of carotid stenosis is based on velocity parameters that correlate the residual internal carotid diameter with NASCET-based stenosis levels, using the  diameter of the distal internal carotid lumen as the denominator for stenosis measurement. The following velocity measurements were obtained: RIGHT ICA: 57/10 cm/sec CCA: 32/2 cm/sec SYSTOLIC ICA/CCA RATIO:  0.8 ECA: 74 cm/sec LEFT ICA: 46/15 cm/sec CCA: 02/5 cm/sec SYSTOLIC ICA/CCA RATIO:  0.6 ECA: 97 cm/sec RIGHT CAROTID ARTERY: There is a minimal moderate amount of eccentric echogenic plaque within the right carotid bulb (image 15), extending to involve the origin and proximal aspects of the right internal carotid artery (image 22), not resulting in elevated peak systolic velocities within the interrogated course of the right internal carotid artery to suggest a hemodynamically significant stenosis. RIGHT VERTEBRAL ARTERY:  Antegrade flow LEFT CAROTID ARTERY: There is a minimal amount of eccentric echogenic plaque within the left  carotid bulb (image 47), extending to involve the origin and proximal aspects of the left internal carotid artery (image 54), not resulting in elevated peak systolic velocities within the interrogated course of the left internal carotid artery to suggest a hemodynamically significant stenosis. LEFT VERTEBRAL ARTERY:  Antegrade flow IMPRESSION: Minimal to moderate amount of bilateral atherosclerotic plaque, right greater than left, not resulting in a hemodynamically significant stenosis within either internal carotid artery. Electronically Signed   By: Sandi Mariscal M.D.   On: 02/03/2021 08:21   ECHOCARDIOGRAM COMPLETE  Result Date: 02/03/2021    ECHOCARDIOGRAM REPORT   Patient Name:   Tanner Oconnell Date of Exam: 02/03/2021 Medical Rec #:  [id number]       Height:       75.0 in Accession #:    [id number]      Weight:       244.3 lb Date of Birth:  03/08/1946        BSA:          2.389 m Patient Age:    39 years        BP:           167/69 mmHg Patient Gender: M               HR:           47 bpm. Exam Location:  [facility] Procedure: 2D Echo, Color Doppler, Cardiac Doppler and Intracardiac             Opacification Agent Indications:     G45.9 TIA  History:         Patient has no prior history of Echocardiogram examinations.                  Arrythmias:Atrial Flutter, Signs/Symptoms:Dementia; Risk                  Factors:Hypertension, Diabetes, Dyslipidemia and Sleep Apnea.  Sonographer:     Charmayne Sheer RDCS (AE) Referring Phys:  Baker Janus Soledad Gerlach NIU Diagnosing Phys: Kathlyn [place] MD  Sonographer Comments: Suboptimal apical window and suboptimal subcostal window. Image acquisition challenging due to patient body habitus and Image acquisition challenging due to uncooperative patient. IMPRESSIONS  1. Left ventricular ejection fraction, by estimation, is 55 to 60%. The left ventricle has normal function. The left ventricle has no regional wall motion abnormalities. The left ventricular internal cavity size was mildly dilated. There is mild left ventricular hypertrophy. Left ventricular diastolic parameters are indeterminate.  2. Right ventricular systolic function is normal. The right ventricular size is normal. Tricuspid regurgitation signal is inadequate for assessing PA pressure.  3. Left atrial size was mildly dilated.  4. The mitral valve is normal in structure. No evidence of mitral valve regurgitation. No evidence of mitral stenosis.  5. The aortic valve is normal in structure. Aortic valve regurgitation is mild. Mild to moderate aortic valve sclerosis/calcification is present, without any evidence of aortic stenosis.  6. Aortic dilatation noted. There is mild dilatation of the aortic root, measuring 39 mm.  7. The inferior vena cava is normal in size with greater than 50% respiratory variability, suggesting right atrial pressure of 3 mmHg. FINDINGS  Left Ventricle: Left ventricular ejection fraction, by estimation, is 55 to 60%. The left ventricle has normal function. The left ventricle has no regional wall motion abnormalities. Definity contrast agent was given IV to delineate the left ventricular   endocardial borders. The left ventricular internal cavity size was mildly dilated. There is  mild left ventricular hypertrophy. Left ventricular diastolic parameters are indeterminate. Right Ventricle: The right ventricular size is normal. No increase in right ventricular wall thickness. Right ventricular systolic function is normal. Tricuspid regurgitation signal is inadequate for assessing PA pressure. Left Atrium: Left atrial size was mildly dilated. Right Atrium: Right atrial size was normal in size. Pericardium: There is no evidence of pericardial effusion. Mitral Valve: The mitral valve is normal in structure. No evidence of mitral valve regurgitation. No evidence of mitral valve stenosis. MV peak gradient, 3.8 mmHg. The mean mitral valve gradient is 2.0 mmHg. Tricuspid Valve: The tricuspid valve is normal in structure. Tricuspid valve regurgitation is not demonstrated. No evidence of tricuspid stenosis. Aortic Valve: The aortic valve is normal in structure. Aortic valve regurgitation is mild. Mild to moderate aortic valve sclerosis/calcification is present, without any evidence of aortic stenosis. Aortic valve mean gradient measures 3.0 mmHg. Aortic valve peak gradient measures 7.4 mmHg. Aortic valve area, by VTI measures 2.84 cm. Pulmonic Valve: The pulmonic valve was normal in structure. Pulmonic valve regurgitation is not visualized. No evidence of pulmonic stenosis. Aorta: Aortic dilatation noted. There is mild dilatation of the aortic root, measuring 39 mm. Venous: The inferior vena cava is normal in size with greater than 50% respiratory variability, suggesting right atrial pressure of 3 mmHg. IAS/Shunts: No atrial level shunt detected by color flow Doppler.  LEFT VENTRICLE PLAX 2D LVIDd:         5.30 cm LVIDs:         3.60 cm LV PW:         1.30 cm LV IVS:        1.10 cm LVOT diam:     2.50 cm LV SV:         69 LV SV Index:   29 LVOT Area:     4.91 cm  LEFT ATRIUM         Index LA diam:    4.20 cm  1.76 cm/m  AORTIC VALVE                   PULMONIC VALVE AV Area (Vmax):    2.95 cm    PV Vmax:       0.77 m/s AV Area (Vmean):   2.99 cm    PV Vmean:      48.500 cm/s AV Area (VTI):     2.84 cm    PV VTI:        0.137 m AV Vmax:           136.00 cm/s PV Peak grad:  2.4 mmHg AV Vmean:          84.400 cm/s PV Mean grad:  1.0 mmHg AV VTI:            0.242 m AV Peak Grad:      7.4 mmHg AV Mean Grad:      3.0 mmHg LVOT Vmax:         81.60 cm/s LVOT Vmean:        51.400 cm/s LVOT VTI:          0.140 m LVOT/AV VTI ratio: 0.58  AORTA Ao Root diam: 3.90 cm MITRAL VALVE MV Area (PHT): 8.20 cm    SHUNTS MV Area VTI:   3.62 cm    Systemic VTI:  0.14 m MV Peak grad:  3.8 mmHg    Systemic Diam: 2.50 cm MV Mean grad:  2.0 mmHg MV Vmax:  0.97 m/s MV Vmean:      58.2 cm/s MV Decel Time: 93 msec MV E velocity: 94.45 cm/s MV A velocity: 59.35 cm/s MV E/A ratio:  1.59 Kathlyn [place] MD Electronically signed by Kathlyn Sacramento MD Signature Date/Time: 02/03/2021/5:21:04 PM    Final      CODE STATUS:     Code Status Orders  (From admission, onward)           Start     Ordered   02/02/21 1613  Do not attempt resuscitation (DNR)  Continuous       Question Answer Comment  In the event of cardiac or respiratory ARREST Do not call a "code blue"   In the event of cardiac or respiratory ARREST Do not perform Intubation, CPR, defibrillation or ACLS   In the event of cardiac or respiratory ARREST Use medication by any route, position, wound care, and other measures to relive pain and suffering. May use oxygen, suction and manual treatment of airway obstruction as needed for comfort.      02/02/21 1613           Code Status History     This patient has a current code status but no historical code status.      Advance Directive Documentation    Flowsheet Row Most Recent Value  Type of Advance Directive Healthcare Power of Attorney  Pre-existing out of facility DNR order (yellow form or pink MOST form) --   "MOST" Form in Place? --        TOTAL TIME TAKING CARE OF THIS PATIENT: 40 minutes.    Fritzi Mandes M.D  [facility]    CC: Primary care physician; Tracie Harrier, MD

## 2021-02-04 NOTE — Progress Notes (Signed)
Tanner Oconnell to be D/C'd Home per MD order.  Discussed prescriptions and follow up appointments with the patient. Prescriptions electronically submitted medication list explained in detail. Pt and wife verbalized understanding. Xarelto coupon given to patient.  Allergies as of 02/04/2021       Reactions   Penicillins Rash   Unknown reaction Red bumps.        Medication List     STOP taking these medications    aspirin 81 MG EC tablet   HYDROcodone-acetaminophen 5-325 MG tablet Commonly known as: NORCO/VICODIN   tamsulosin 0.4 MG Caps capsule Commonly known as: FLOMAX       TAKE these medications    amLODipine 5 MG tablet Commonly known as: NORVASC TAKE 1 TABLET ONCE DAILY   ascorbic acid 1000 MG tablet Commonly known as: VITAMIN C Take 1,000 mg by mouth daily.   atorvastatin 40 MG tablet Commonly known as: LIPITOR Take 1 tablet (40 mg total) by mouth daily.   CALCIUM-VITAMIN D PO Take by mouth.   docusate sodium 100 MG capsule Commonly known as: COLACE Take 1 capsule (100 mg total) by mouth 2 (two) times daily. For constipation.   donepezil 10 MG tablet Commonly known as: ARICEPT Take 10 mg by mouth daily.   fenofibrate 145 MG tablet Commonly known as: TRICOR Take 1 tablet by mouth daily. What changed: Another medication with the same name was removed. Continue taking this medication, and follow the directions you see here.   Fish Oil 1000 MG Caps Take by mouth.   gabapentin 100 MG capsule Commonly known as: NEURONTIN Take 1 capsule by mouth 2 (two) times daily.   glimepiride 1 MG tablet Commonly known as: AMARYL Take 1 tablet by mouth in the morning. What changed: Another medication with the same name was removed. Continue taking this medication, and follow the directions you see here.   glimepiride 2 MG tablet Commonly known as: AMARYL Take 2 mg by mouth at bedtime. What changed: Another medication with the same name was removed. Continue  taking this medication, and follow the directions you see here.   levothyroxine 25 MCG tablet Commonly known as: SYNTHROID Take 25 mcg by mouth daily.   losartan-hydrochlorothiazide 100-25 MG tablet Commonly known as: HYZAAR Take 1 tablet by mouth daily.   memantine 10 MG tablet Commonly known as: NAMENDA Take 10 mg by mouth 2 (two) times daily.   metFORMIN 1000 MG tablet Commonly known as: GLUCOPHAGE Take 1,000 mg by mouth 2 (two) times daily.   Multi-Vitamin tablet Take by mouth.   omeprazole 20 MG capsule Commonly known as: PRILOSEC Take 20 mg by mouth daily.   propranolol 10 MG tablet Commonly known as: INDERAL Take 10 mg by mouth 2 (two) times daily.   QUEtiapine 25 MG tablet Commonly known as: SEROQUEL Take 25 mg by mouth at bedtime.   rivaroxaban 20 MG Tabs tablet Commonly known as: XARELTO Take 1 tablet (20 mg total) by mouth daily with supper.   sertraline 100 MG tablet Commonly known as: ZOLOFT Take 100 mg by mouth daily.   vitamin B-12 1000 MCG tablet Commonly known as: CYANOCOBALAMIN Take 1,000 mcg by mouth daily.        Vitals:   02/04/21 0422 02/04/21 0737  BP: (!) 144/74 (!) 149/70  Pulse: (!) 42 (!) 42  Resp: 20 18  Temp: 97.6 F (36.4 C) 97.8 F (36.6 C)  SpO2: 97% 97%    Skin clean, dry and intact without evidence of  skin break down, no evidence of skin tears noted. IV catheter discontinued intact. Site without signs and symptoms of complications. Dressing and pressure applied. Pt denies pain at this time. No complaints noted.  An After Visit Summary was printed and given to the patient. Patient escorted via [place], and D/C home via private auto.  [place]

## 2021-02-04 NOTE — Progress Notes (Signed)
TTE reveals LVEF of 55 to 60% and no left ventricular regional wall motion abnormalities. Left atrial size was mildly dilated. No valvular vegetation, mural thrombus or ASD mentioned in the report.   The patient has been started on Xarelto for secondary stroke prevention in the context of his newly diagnosed atrial fibrillation/flutter.   Will need outpatient Neurology follow up.   Neurohospitalist service will sign off. Please call if there are additional questions.   Electronically signed: Dr. Kerney Elbe

## 2021-02-04 NOTE — Progress Notes (Addendum)
Progress Note  Patient Name: Tanner Oconnell Date of Encounter: 02/04/2021  Primary Cardiologist: Kathlyn Sacramento, MD  Subjective   Pt denies chest pain, dyspnea, palpitations.  No recurrent slurring of speech.  Wife at bedside.  Per wife, pt doesn't believe that he had a stroke.  Long discussion today, explaining MRI findings, finding of aflutter, and need for [place].  Inpatient Medications    Scheduled Meds:   stroke: mapping our early stages of recovery book   Does not apply Once   ascorbic acid  1,000 mg Oral Daily   atorvastatin  40 mg Oral Daily   calcium-vitamin D  1 tablet Oral Daily   donepezil  10 mg Oral Daily   fenofibrate  160 mg Oral Daily   gabapentin  100 mg Oral BID   insulin aspart  0-5 Units Subcutaneous QHS   insulin aspart  0-9 Units Subcutaneous TID WC   levothyroxine  25 mcg Oral Daily   memantine  10 mg Oral BID   multivitamin with minerals  1 tablet Oral Daily   omega-3 acid ethyl esters  1 g Oral Daily   QUEtiapine  25 mg Oral QHS   rivaroxaban  20 mg Oral Q supper   sertraline  100 mg Oral Daily   vitamin B-12  1,000 mcg Oral Daily   Continuous Infusions:  PRN Meds: acetaminophen **OR** acetaminophen (TYLENOL) oral liquid 160 mg/5 mL **OR** acetaminophen, docusate sodium, hydrALAZINE, ondansetron (ZOFRAN) IV   Vital Signs    Vitals:   02/03/21 1625 02/03/21 1927 02/04/21 0422 02/04/21 0737  BP: (!) 144/76 (!) 157/75 (!) 144/74 (!) 149/70  Pulse: (!) 58 (!) 54 (!) 42 (!) 42  Resp:  20 20 18   Temp: 98.3 F (36.8 C) 99 F (37.2 C) 97.6 F (36.4 C) 97.8 F (36.6 C)  TempSrc: Oral Oral Oral   SpO2: 96% 100% 97% 97%  Weight:      Height:        Intake/Output Summary (Last 24 hours) at 02/04/2021 1004 Last data filed at 02/03/2021 1700 Gross per 24 hour  Intake 338.78 ml  Output 225 ml  Net 113.78 ml   Filed Weights   02/02/21 1612 02/02/21 2107 02/02/21 2158  Weight: 110 kg 110.8 kg 110.8 kg    Physical Exam   GEN: Well nourished,  well developed, in no acute distress.  HEENT: Grossly normal.  Neck: Supple, no JVD, carotid bruits, or masses. Cardiac: Ir, Ir, no murmurs, rubs, or gallops. No clubbing, cyanosis, edema.  Radials 2+, DP/PT 2+ and equal bilaterally.  Respiratory:  Respirations regular and unlabored, clear to auscultation bilaterally. GI: Soft, nontender, nondistended, BS + x 4. MS: no deformity or atrophy. Skin: warm and dry, no rash. Neuro:  Strength and sensation are intact. Psych: Disoriented to time, otw awake, alert, and oriented.  Normal affect.  Labs    Chemistry Recent Labs  Lab 02/02/21 1331  NA 141  K 4.4  CL 107  CO2 28  GLUCOSE 128*  BUN 22  CREATININE 1.24  CALCIUM 9.5  GFRNONAA 60*  ANIONGAP 6     Hematology Recent Labs  Lab 02/02/21 1331 02/03/21 0105 02/04/21 0615  WBC 4.4 3.9* 4.7  RBC 4.45 4.12* 4.34  HGB 13.2 12.1* 12.9*  HCT 39.0 36.8* 37.5*  MCV 87.6 89.3 86.4  MCH 29.7 29.4 29.7  MCHC 33.8 32.9 34.4  RDW 14.0 13.9 13.7  PLT 148* 120* 140*    Cardiac Enzymes  Recent Labs  Lab 02/02/21 1331 02/02/21 1524 02/02/21 1822 02/02/21 2127  TROPONINIHS 203* 191* 187* 207*     Lipids  Lab Results  Component Value Date   CHOL 149 02/03/2021   HDL 27 (L) 02/03/2021   LDLCALC 73 02/03/2021   TRIG 245 (H) 02/03/2021   CHOLHDL 5.5 02/03/2021    HbA1c  Lab Results  Component Value Date   HGBA1C 6.2 (H) 02/03/2021    Radiology    CT Head Wo Contrast  Result Date: 02/02/2021 CLINICAL DATA:  Acute neuro deficit.  Slurred speech. EXAM: CT HEAD WITHOUT CONTRAST TECHNIQUE: Contiguous axial images were obtained from the base of the skull through the vertex without intravenous contrast. COMPARISON:  MRI head 10/07/2018 FINDINGS: Brain: Moderate atrophy with mild ventricular enlargement. Mild white matter changes. Negative for acute infarct, hemorrhage, mass. Vascular: Negative for hyperdense vessel Skull: Negative Sinuses/Orbits: Negative Other: None  IMPRESSION: No acute abnormality. Generalized atrophy and mild chronic microvascular ischemic change in the white matter. Electronically Signed   By: Franchot Gallo M.D.   On: 02/02/2021 15:15   MR ANGIO HEAD WO CONTRAST  Result Date: 02/02/2021 CLINICAL DATA:  TIA.  Slurred speech EXAM: MRI HEAD WITHOUT CONTRAST MRA HEAD WITHOUT CONTRAST TECHNIQUE: Multiplanar, multi-echo pulse sequences of the brain and surrounding structures were acquired without intravenous contrast. Angiographic images of the Circle of Willis were acquired using MRA technique without intravenous contrast. COMPARISON:  CT head 02/02/2021 FINDINGS: MRI HEAD FINDINGS Brain: 3 mm diffusion positivity in the right frontal operculum compatible with acute infarct. No other areas of restricted diffusion Generalized atrophy without hydrocephalus. Mild white matter changes consistent with chronic microvascular ischemia. Negative for hemorrhage or mass. Vascular: Normal arterial flow voids. Skull and upper cervical spine: Negative Sinuses/Orbits: With mucosal edema left contraction maxillary. Bilateral sinus cataract extraction Other: None MRA HEAD FINDINGS Anterior circulation: Internal carotid artery widely patent bilaterally. Hypoplastic left A1 segment. Both anterior cerebral arteries are patent supplied primarily from the right. Middle cerebral arteries patent bilaterally. Severe focal stenosis in posterior right M2 branch. Moderate stenosis left anterior M2 branch. No vascular malformation or large vessel occlusion. Posterior circulation: Left vertebral artery dominant. Both vertebral arteries patent to the basilar. PICA not included on the study. AICA patent bilaterally. Basilar widely patent. Superior cerebellar and posterior cerebral arteries patent bilaterally. Mild stenosis in the left P2 segment. No vascular malformation Anatomic variants: None IMPRESSION: Small 3 mm area of acute infarct in the right frontal operculum. Atrophy and mild  chronic microvascular ischemic change Severe focal stenosis posterior right M2 branch. Moderate stenosis left anterior M2 branch. Mild stenosis left P2 branch. Electronically Signed   By: Franchot Gallo M.D.   On: 02/02/2021 17:04   MR BRAIN WO CONTRAST  Result Date: 02/02/2021 CLINICAL DATA:  TIA.  Slurred speech EXAM: MRI HEAD WITHOUT CONTRAST MRA HEAD WITHOUT CONTRAST TECHNIQUE: Multiplanar, multi-echo pulse sequences of the brain and surrounding structures were acquired without intravenous contrast. Angiographic images of the Circle of Willis were acquired using MRA technique without intravenous contrast. COMPARISON:  CT head 02/02/2021 FINDINGS: MRI HEAD FINDINGS Brain: 3 mm diffusion positivity in the right frontal operculum compatible with acute infarct. No other areas of restricted diffusion Generalized atrophy without hydrocephalus. Mild white matter changes consistent with chronic microvascular ischemia. Negative for hemorrhage or mass. Vascular: Normal arterial flow voids. Skull and upper cervical spine: Negative Sinuses/Orbits: With mucosal edema left contraction maxillary. Bilateral sinus cataract extraction Other: None MRA HEAD FINDINGS Anterior circulation: Internal carotid artery widely  patent bilaterally. Hypoplastic left A1 segment. Both anterior cerebral arteries are patent supplied primarily from the right. Middle cerebral arteries patent bilaterally. Severe focal stenosis in posterior right M2 branch. Moderate stenosis left anterior M2 branch. No vascular malformation or large vessel occlusion. Posterior circulation: Left vertebral artery dominant. Both vertebral arteries patent to the basilar. PICA not included on the study. AICA patent bilaterally. Basilar widely patent. Superior cerebellar and posterior cerebral arteries patent bilaterally. Mild stenosis in the left P2 segment. No vascular malformation Anatomic variants: None IMPRESSION: Small 3 mm area of acute infarct in the right  frontal operculum. Atrophy and mild chronic microvascular ischemic change Severe focal stenosis posterior right M2 branch. Moderate stenosis left anterior M2 branch. Mild stenosis left P2 branch. Electronically Signed   By: Franchot Gallo M.D.   On: 02/02/2021 17:04   US Carotid Bilateral  Result Date: 02/03/2021 CLINICAL DATA:  Stroke.  History of hypertension and diabetes. EXAM: BILATERAL CAROTID DUPLEX ULTRASOUND TECHNIQUE: Pearline Cables scale imaging, color Doppler and duplex ultrasound were performed of bilateral carotid and vertebral arteries in the neck. COMPARISON:  None. FINDINGS: Criteria: Quantification of carotid stenosis is based on velocity parameters that correlate the residual internal carotid diameter with NASCET-based stenosis levels, using the diameter of the distal internal carotid lumen as the denominator for stenosis measurement. The following velocity measurements were obtained: RIGHT ICA: 57/10 cm/sec CCA: 50/9 cm/sec SYSTOLIC ICA/CCA RATIO:  0.8 ECA: 74 cm/sec LEFT ICA: 46/15 cm/sec CCA: 32/6 cm/sec SYSTOLIC ICA/CCA RATIO:  0.6 ECA: 97 cm/sec RIGHT CAROTID ARTERY: There is a minimal moderate amount of eccentric echogenic plaque within the right carotid bulb (image 15), extending to involve the origin and proximal aspects of the right internal carotid artery (image 22), not resulting in elevated peak systolic velocities within the interrogated course of the right internal carotid artery to suggest a hemodynamically significant stenosis. RIGHT VERTEBRAL ARTERY:  Antegrade flow LEFT CAROTID ARTERY: There is a minimal amount of eccentric echogenic plaque within the left carotid bulb (image 47), extending to involve the origin and proximal aspects of the left internal carotid artery (image 54), not resulting in elevated peak systolic velocities within the interrogated course of the left internal carotid artery to suggest a hemodynamically significant stenosis. LEFT VERTEBRAL ARTERY:  Antegrade flow  IMPRESSION: Minimal to moderate amount of bilateral atherosclerotic plaque, right greater than left, not resulting in a hemodynamically significant stenosis within either internal carotid artery. Electronically Signed   By: Sandi Mariscal M.D.   On: 02/03/2021 08:21   Telemetry    Atrial activity less organized on tele - more afib appearing than previously, mostly 50's to 60's w drops into 40, esp when sleeping - Personally Reviewed  Cardiac Studies   2D Echocardiogram 7.1.2022   1. Left ventricular ejection fraction, by estimation, is 55 to 60%. The  left ventricle has normal function. The left ventricle has no regional  wall motion abnormalities. The left ventricular internal cavity size was  mildly dilated. There is mild left  ventricular hypertrophy. Left ventricular diastolic parameters are  indeterminate.   2. Right ventricular systolic function is normal. The right ventricular  size is normal. Tricuspid regurgitation signal is inadequate for assessing  PA pressure.   3. Left atrial size was mildly dilated.   4. The mitral valve is normal in structure. No evidence of mitral valve  regurgitation. No evidence of mitral stenosis.   5. The aortic valve is normal in structure. Aortic valve regurgitation is  mild. Mild to  moderate aortic valve sclerosis/calcification is present,  without any evidence of aortic stenosis.   6. Aortic dilatation noted. There is mild dilatation of the aortic root,  measuring 39 mm.   7. The inferior vena cava is normal in size with greater than 50%  respiratory variability, suggesting right atrial pressure of 3 mmHg.  _____________   Patient Profile     75 y.o. male with a history of HTN, HLD, DM2, CKD 3a, OSA, hypothyroidism, OSA, and mixed Alzheimer's and vascular dementia, who was admitted 6/30 w/ slurred speech and finding of aflutter and small 59mm R frontal operculum stroke.  Assessment & Plan    1.  Persistent Afib/flutter:  Presented 6/30 w/  slurred speech and noted to be in aflutter.  Notes indicate that he had rates in the 180's upon EMS arrival, however, he has been rate controlled and generally bradycardic, since arrival in the ED.  He was found to have a small area of acute infarct on brain MRI.  He has otw been asymptomatic from Afib/flutter standpoint (looks more like fib this AM).  Echo w/ nl EF.  Per wife, he has a h/o OSA but does not use CPAP, and we have seen bradycardia w/ rates into the 40's during periods of sleep/napping, which his wife says that he does freq @ home.  He has been on propranolol 10mg  bid chronically in the setting of resting tremor.  Cont ? blocker @ current dose.  Xarelto started 7/1.  Given lack of Ss, good rate control, and dementia, we have collectively agreed w/ wife/pt to pursue rate control strategy.  2.  CVA/Cerebrovascular Dzs:  MRI w/ small 3 mm area of acute infarct in the right frontal operculum along w/ severe focal stenosis of the posterior right M2 branch, moderate stenosis of the left anterior M2 branch, and mild stenosis left P2 branch.  Carotid u/s w/ nonobs dzs.  CVA occurred in setting of atrial flutter of unknown duration (see above).  Discussed w/ wife/pt today.  Cont xarelto.  3.  Demand ischemia:  In the setting of above, along w/ small stroke, HsTrop elev @ 203  191 187  207.  Suspect demand ischemia.  No prior h/o chest pain, DOE, or recent change in activity tolerance. Echo 7/1 w/ nl EF and w/o rwma.  Cont ? blocker and statin. No ASA in setting of xarelto.  4.  Essential HTN:  BP trending in 140's in setting of holding home meds and permissive HTN s/p CVA.  Plan to resume prior home doses of amlodipine and losartan-hctz.  5.  Hypothyroidism:  TSH 7.481.  Levothyroxine mgmt per IM.  6.  DMII:  SSI per IM.  7.  CKD III:  creat @ baseline 6/30.  Signed, Murray Hodgkins, NP  02/04/2021, 10:04 AM    For questions or updates, please contact   Please consult [email or web address] for  contact info under Cardiology/STEMI.

## 2021-02-13 ENCOUNTER — Telehealth: Payer: Self-pay | Admitting: Cardiovascular Disease

## 2021-02-13 NOTE — Telephone Encounter (Signed)
Reviewed appointment information and requested they place him in DOD slot due to afib, TIA, and need for 2 week follow up post hospital stay. Left voicemail message to call back.

## 2021-02-13 NOTE — Telephone Encounter (Signed)
Spoke with patients wife and I could hear him in the background. Offered appointment for this Thursday 7/14 at 3:20 pm to come in for his post hospital appointment. She confirmed with patient whom I could hear and she was agreeable with this date and time. She verbalized understanding of our conversation, agreeable with plan, and had no further questions at this time.

## 2021-02-13 NOTE — Telephone Encounter (Signed)
Patient was called to be rescheduled due to an overbooked apt. Patient was supposed to be seen for a 2 week hosp fu due to a recent "mini stroke" per the wife. Patient has now been rescheduled from 7/25 to 8/25 w. Ar[place].   Please advise if this appointment is urgent and should be moved up. Along with a appointment suggestion as the schedules are full at the moment

## 2021-02-16 ENCOUNTER — Ambulatory Visit: Payer: Medicare HMO | Admitting: Cardiovascular Disease

## 2021-02-16 ENCOUNTER — Encounter: Payer: Self-pay | Admitting: Cardiovascular Disease

## 2021-02-16 ENCOUNTER — Other Ambulatory Visit: Payer: Self-pay

## 2021-02-16 VITALS — BP 130/70 | HR 59 | Ht 72.0 in | Wt 247.4 lb

## 2021-02-16 DIAGNOSIS — I10 Essential (primary) hypertension: Secondary | ICD-10-CM | POA: Diagnosis not present

## 2021-02-16 DIAGNOSIS — I480 Paroxysmal atrial fibrillation: Secondary | ICD-10-CM | POA: Diagnosis not present

## 2021-02-16 NOTE — Progress Notes (Signed)
Cardiology Office Note   Date:  02/16/2021   ID:  Tanner Oconnell, DOB Aug 26, 1945, MRN [id number]  PCP:  Tracie Harrier, MD  Cardiologist:   Kathlyn Sacramento, MD   Chief Complaint  Patient presents with   OTHER    Afib/TIA 2 wk f/u no complaints today. Meds reviewed verbally with pt.      History of Present Illness: Tanner Oconnell is a 75 y.o. male who presents for follow-up visit regarding atrial fibrillation. He has known history of essential hypertension, hyperlipidemia, type 2 diabetes, obstructive sleep apnea and mixed Alzheimer's and vascular dementia. The patient was recently hospitalized with slurred speech and was diagnosed with small stroke which was confirmed with MRI.  His EKG showed atrial fibrillation which was a new diagnosis.  Echocardiogram showed normal LV systolic function with no significant valvular abnormalities. The patient was not felt to be symptomatic from atrial fibrillation.  He was started on anticoagulation with Xarelto. He has been doing reasonably well with no chest pain, shortness of breath or palpitations.  He is hard of hearing and has significant dementia.  No reported side effects with anticoagulation.   Past Medical History:  Diagnosis Date   Dementia ([place])    Diabetes mellitus without complication ([place])    History of kidney stones    Hyperlipidemia    Hypertension    Hypothyroidism    Obstructive sleep apnea    refuses to wear cpap   Thrombocytopenia (HCC)    Thyroid disease    Hypothyroidism    Past Surgical History:  Procedure Laterality Date   BONE MARROW BIOPSY     COLONOSCOPY     COLONOSCOPY WITH PROPOFOL N/A 02/24/2020   Procedure: COLONOSCOPY WITH PROPOFOL;  Surgeon: Robert Bellow, MD;  Location: [place];  Service: Gastroenterology;  Laterality: N/A;   ESOPHAGOGASTRODUODENOSCOPY N/A 02/24/2020   Procedure: ESOPHAGOGASTRODUODENOSCOPY (EGD);  Surgeon: Robert Bellow, MD;  Location: [facility] ENDOSCOPY;  Service:  Gastroenterology;  Laterality: N/A;     Current Outpatient Medications  Medication Sig Dispense Refill   amLODipine (NORVASC) 5 MG tablet TAKE 1 TABLET ONCE DAILY     ascorbic acid (VITAMIN C) 1000 MG tablet Take 1,000 mg by mouth daily.     atorvastatin (LIPITOR) 40 MG tablet Take 1 tablet (40 mg total) by mouth daily. 30 tablet 0   CALCIUM-VITAMIN D PO Take by mouth.     donepezil (ARICEPT) 10 MG tablet Take 10 mg by mouth daily.     fenofibrate (TRICOR) 145 MG tablet Take 1 tablet by mouth daily.     gabapentin (NEURONTIN) 100 MG capsule Take 1 capsule by mouth 2 (two) times daily.     glimepiride (AMARYL) 1 MG tablet Take 1 tablet by mouth in the morning.     glimepiride (AMARYL) 2 MG tablet Take 2 mg by mouth at bedtime.     levothyroxine (SYNTHROID) 25 MCG tablet Take 25 mcg by mouth daily.     losartan-hydrochlorothiazide (HYZAAR) 100-25 MG tablet Take 1 tablet by mouth daily.     memantine (NAMENDA) 10 MG tablet Take 10 mg by mouth 2 (two) times daily.     metFORMIN (GLUCOPHAGE) 1000 MG tablet Take 1,000 mg by mouth 2 (two) times daily.     Multiple Vitamin (MULTI-VITAMIN) tablet Take by mouth.     omeprazole (PRILOSEC) 20 MG capsule Take 20 mg by mouth daily.     propranolol (INDERAL) 10 MG tablet Take 10 mg by mouth 2 (two)  times daily.     QUEtiapine (SEROQUEL) 25 MG tablet Take 25 mg by mouth at bedtime.     rivaroxaban (XARELTO) 20 MG TABS tablet Take 1 tablet (20 mg total) by mouth daily with supper. 30 tablet 1   sertraline (ZOLOFT) 100 MG tablet Take 100 mg by mouth daily.     docusate sodium (COLACE) 100 MG capsule Take 1 capsule (100 mg total) by mouth 2 (two) times daily. For constipation. (Patient not taking: Reported on 02/16/2021) 30 capsule 0   Omega-3 Fatty Acids (FISH OIL) 1000 MG CAPS Take by mouth. (Patient not taking: Reported on 02/16/2021)     vitamin B-12 (CYANOCOBALAMIN) 1000 MCG tablet Take 1,000 mcg by mouth daily. (Patient not taking: Reported on 02/16/2021)      No current facility-administered medications for this visit.    Allergies:   Penicillins    Social History:  The patient  reports that he has never smoked. He quit smokeless tobacco use about 11 years ago.  His smokeless tobacco use included chew. He reports that he does not drink alcohol and does not use drugs.   Family History:  The patient's family history includes Cancer in his father and mother.    ROS:  Please see the history of present illness.   Otherwise, review of systems are positive for none.   All other systems are reviewed and negative.    PHYSICAL EXAM: VS:  BP 130/70 (BP Location: Left Arm, Patient Position: Sitting, Cuff Size: Normal)   Pulse (!) 59   Ht 6' (1.829 m)   Wt 247 lb 6 oz (112.2 kg)   SpO2 98%   BMI 33.55 kg/m  , BMI Body mass index is 33.55 kg/m. GEN: Well nourished, well developed, in no acute distress  HEENT: normal  Neck: no JVD, carotid bruits, or masses Cardiac: Regular rate and rhythm; no murmurs, rubs, or gallops,no edema  Respiratory:  clear to auscultation bilaterally, normal work of breathing GI: soft, nontender, nondistended, + BS MS: no deformity or atrophy  Skin: warm and dry, no rash Neuro:  Strength and sensation are intact Psych: euthymic mood, full affect   EKG:  EKG is ordered today. The ekg ordered today demonstrates junctional rhythm with a heart rate of 59 bpm.  Nonspecific T wave changes.   Recent Labs: 02/02/2021: BUN 22; Creatinine, Ser 1.24; Potassium 4.4; Sodium 141 02/03/2021: TSH 7.481 02/04/2021: Hemoglobin 12.9; Platelets 140    Lipid Panel    Component Value Date/Time   CHOL 149 02/03/2021 0105   TRIG 245 (H) 02/03/2021 0105   HDL 27 (L) 02/03/2021 0105   CHOLHDL 5.5 02/03/2021 0105   VLDL 49 (H) 02/03/2021 0105   LDLCALC 73 02/03/2021 0105      Wt Readings from Last 3 Encounters:  02/16/21 247 lb 6 oz (112.2 kg)  02/02/21 244 lb 4.8 oz (110.8 kg)  02/24/20 255 lb (115.7 kg)       No  flowsheet data found.    ASSESSMENT AND PLAN:  1.  Paroxysmal atrial fibrillation:  CHA2DS2-VASc score of 6.  He seems to be in junctional rhythm today.  No indication for rhythm control given the lack of symptoms.  Recommend long-term anticoagulation with Xarelto.  2.  Recent CVA: He seems to have recovered.  3.  Demand ischemia: Mildly elevated troponin likely due to supply demand ischemia.  Given lack of anginal symptoms, I do not recommend any ischemic cardiac evaluation.  His recent echocardiogram was reviewed and showed  normal LV systolic function and wall motion.  4.  Essential hypertension: Blood pressure is well controlled on current medications.  5.  Dementia: On medications seems to be stable.    Disposition:   FU with me in 4 months  Signed,  Kathlyn Sacramento, MD  02/16/2021 3:25 PM    [place]

## 2021-02-16 NOTE — Patient Instructions (Signed)
Medication Instructions:  Your physician recommends that you continue on your current medications as directed. Please refer to the Current Medication list given to you today.  *If you need a refill on your cardiac medications before your next appointment, please call your pharmacy*   Lab Work: None ordered  If you have labs (blood work) drawn today and your tests are completely normal, you will receive your results only by: MyChart Message (if you have MyChart) OR A paper copy in the mail If you have any lab test that is abnormal or we need to change your treatment, we will call you to review the results.   Testing/Procedures: None ordered   Follow-Up: At [facility], you and your health needs are our priority.  As part of our continuing mission to provide you with exceptional heart care, we have created designated Provider Care Teams.  These Care Teams include your primary Cardiologist (physician) and Advanced Practice Providers (APPs -  Physician Assistants and Nurse Practitioners) who all work together to provide you with the care you need, when you need it.  We recommend signing up for the patient portal called "MyChart".  Sign up information is provided on this After Visit Summary.  MyChart is used to connect with patients for Virtual Visits (Telemedicine).  Patients are able to view lab/test results, encounter notes, upcoming appointments, etc.  Non-urgent messages can be sent to your provider as well.   To learn more about what you can do with MyChart, go to [email or web address].    Your next appointment:   4 month(s)  The format for your next appointment:   In Person  Provider:   You may see Muhammad Arida, MD or one of the following Advanced Practice Providers on your designated Care Team:   Christopher Berge, NP Ryan Dunn, PA-C Jacquelyn Visser, PA-C Cadence Furth, PA-C   Other Instructions N/A  

## 2021-02-24 DIAGNOSIS — G4733 Obstructive sleep apnea (adult) (pediatric): Secondary | ICD-10-CM | POA: Diagnosis not present

## 2021-02-24 DIAGNOSIS — I10 Essential (primary) hypertension: Secondary | ICD-10-CM | POA: Diagnosis not present

## 2021-02-24 DIAGNOSIS — G309 Alzheimer's disease, unspecified: Secondary | ICD-10-CM | POA: Diagnosis not present

## 2021-02-24 DIAGNOSIS — E782 Mixed hyperlipidemia: Secondary | ICD-10-CM | POA: Diagnosis not present

## 2021-02-24 DIAGNOSIS — E039 Hypothyroidism, unspecified: Secondary | ICD-10-CM | POA: Diagnosis not present

## 2021-02-24 DIAGNOSIS — K625 Hemorrhage of anus and rectum: Secondary | ICD-10-CM | POA: Diagnosis not present

## 2021-02-24 DIAGNOSIS — H9190 Unspecified hearing loss, unspecified ear: Secondary | ICD-10-CM | POA: Diagnosis not present

## 2021-02-24 DIAGNOSIS — Z09 Encounter for follow-up examination after completed treatment for conditions other than malignant neoplasm: Secondary | ICD-10-CM | POA: Diagnosis not present

## 2021-02-24 DIAGNOSIS — Z79899 Other long term (current) drug therapy: Secondary | ICD-10-CM | POA: Diagnosis not present

## 2021-02-27 ENCOUNTER — Ambulatory Visit: Payer: Medicare HMO | Admitting: Physician Assistant

## 2021-02-27 DIAGNOSIS — M25561 Pain in right knee: Secondary | ICD-10-CM | POA: Diagnosis not present

## 2021-02-27 DIAGNOSIS — M13861 Other specified arthritis, right knee: Secondary | ICD-10-CM | POA: Diagnosis not present

## 2021-03-13 ENCOUNTER — Other Ambulatory Visit: Payer: Self-pay | Admitting: *Deleted

## 2021-03-13 MED ORDER — ATORVASTATIN CALCIUM 40 MG PO TABS: 40.0000 mg | ORAL_TABLET | Freq: Every day | ORAL | 3 refills | Status: DC

## 2021-03-28 DIAGNOSIS — E1165 Type 2 diabetes mellitus with hyperglycemia: Secondary | ICD-10-CM | POA: Diagnosis not present

## 2021-03-28 DIAGNOSIS — Z0000 Encounter for general adult medical examination without abnormal findings: Secondary | ICD-10-CM | POA: Diagnosis not present

## 2021-03-28 DIAGNOSIS — E782 Mixed hyperlipidemia: Secondary | ICD-10-CM | POA: Diagnosis not present

## 2021-03-28 DIAGNOSIS — E039 Hypothyroidism, unspecified: Secondary | ICD-10-CM | POA: Diagnosis not present

## 2021-03-28 DIAGNOSIS — D649 Anemia, unspecified: Secondary | ICD-10-CM | POA: Diagnosis not present

## 2021-03-28 DIAGNOSIS — H9193 Unspecified hearing loss, bilateral: Secondary | ICD-10-CM | POA: Diagnosis not present

## 2021-03-28 DIAGNOSIS — M545 Low back pain, unspecified: Secondary | ICD-10-CM | POA: Diagnosis not present

## 2021-03-28 DIAGNOSIS — I10 Essential (primary) hypertension: Secondary | ICD-10-CM | POA: Diagnosis not present

## 2021-03-28 DIAGNOSIS — Z9181 History of falling: Secondary | ICD-10-CM | POA: Diagnosis not present

## 2021-03-30 ENCOUNTER — Ambulatory Visit: Payer: Medicare HMO | Admitting: Cardiovascular Disease

## 2021-04-03 DIAGNOSIS — J209 Acute bronchitis, unspecified: Secondary | ICD-10-CM | POA: Diagnosis not present

## 2021-04-03 DIAGNOSIS — E782 Mixed hyperlipidemia: Secondary | ICD-10-CM | POA: Diagnosis not present

## 2021-04-03 DIAGNOSIS — D649 Anemia, unspecified: Secondary | ICD-10-CM | POA: Diagnosis not present

## 2021-04-03 DIAGNOSIS — H903 Sensorineural hearing loss, bilateral: Secondary | ICD-10-CM | POA: Diagnosis not present

## 2021-04-03 DIAGNOSIS — G4733 Obstructive sleep apnea (adult) (pediatric): Secondary | ICD-10-CM | POA: Diagnosis not present

## 2021-04-03 DIAGNOSIS — I10 Essential (primary) hypertension: Secondary | ICD-10-CM | POA: Diagnosis not present

## 2021-04-03 DIAGNOSIS — D696 Thrombocytopenia, unspecified: Secondary | ICD-10-CM | POA: Diagnosis not present

## 2021-04-03 DIAGNOSIS — E1165 Type 2 diabetes mellitus with hyperglycemia: Secondary | ICD-10-CM | POA: Diagnosis not present

## 2021-04-03 DIAGNOSIS — E039 Hypothyroidism, unspecified: Secondary | ICD-10-CM | POA: Diagnosis not present

## 2021-04-06 ENCOUNTER — Other Ambulatory Visit: Payer: Self-pay | Admitting: Cardiovascular Disease

## 2021-04-06 NOTE — Telephone Encounter (Signed)
Prescription refill request for Xarelto received.  Indication:afib Last office visit:arida 02/16/21 Weight:112.2kg Age:32mScr:1.3 03/28/21 CrCl:75.5

## 2021-04-07 ENCOUNTER — Other Ambulatory Visit: Payer: Self-pay | Admitting: *Deleted

## 2021-04-07 MED ORDER — ATORVASTATIN CALCIUM 40 MG PO TABS: 40.0000 mg | ORAL_TABLET | Freq: Every day | ORAL | 3 refills | Status: AC

## 2021-04-07 MED ORDER — RIVAROXABAN 20 MG PO TABS: 20.0000 mg | ORAL_TABLET | Freq: Every day | ORAL | 1 refills | Status: AC

## 2021-04-07 NOTE — Telephone Encounter (Signed)
Medications were sent to local pharmacy, patient would like it sent to mail order pharmacy now. I have already sent the Atorvastatin, please send the Xarelto. Thank you.

## 2021-04-07 NOTE — Telephone Encounter (Signed)
Previously did yesterday  Prescription refill request for Xarelto received.  Indication:afib Last office visit:arida 02/16/21 Weight:112.2kg Age:71mScr:1.3 03/28/21 CrCl:75.5

## 2021-04-07 NOTE — Telephone Encounter (Signed)
Prescription refill request for Xarelto received.   Indication: afib  Last office visit: Arida, 02/16/2021 Weight: 112.2 kg  Age: 75 yo  Scr: 1.3, 03/28/2021 CrCl: 75 ml/min

## 2021-04-17 DIAGNOSIS — D649 Anemia, unspecified: Secondary | ICD-10-CM | POA: Diagnosis not present

## 2021-05-04 ENCOUNTER — Other Ambulatory Visit: Payer: Self-pay | Admitting: Internal Medicine

## 2021-05-04 DIAGNOSIS — F0390 Unspecified dementia without behavioral disturbance: Secondary | ICD-10-CM | POA: Diagnosis not present

## 2021-05-04 DIAGNOSIS — H9190 Unspecified hearing loss, unspecified ear: Secondary | ICD-10-CM | POA: Diagnosis not present

## 2021-05-04 DIAGNOSIS — R441 Visual hallucinations: Secondary | ICD-10-CM

## 2021-05-04 DIAGNOSIS — D696 Thrombocytopenia, unspecified: Secondary | ICD-10-CM | POA: Diagnosis not present

## 2021-05-04 DIAGNOSIS — E039 Hypothyroidism, unspecified: Secondary | ICD-10-CM | POA: Diagnosis not present

## 2021-05-04 DIAGNOSIS — F0150 Vascular dementia without behavioral disturbance: Secondary | ICD-10-CM

## 2021-05-04 DIAGNOSIS — E119 Type 2 diabetes mellitus without complications: Secondary | ICD-10-CM | POA: Diagnosis not present

## 2021-05-04 DIAGNOSIS — M542 Cervicalgia: Secondary | ICD-10-CM

## 2021-05-04 DIAGNOSIS — G309 Alzheimer's disease, unspecified: Secondary | ICD-10-CM

## 2021-05-04 DIAGNOSIS — G4733 Obstructive sleep apnea (adult) (pediatric): Secondary | ICD-10-CM | POA: Diagnosis not present

## 2021-05-04 DIAGNOSIS — I10 Essential (primary) hypertension: Secondary | ICD-10-CM | POA: Diagnosis not present

## 2021-05-16 ENCOUNTER — Ambulatory Visit: Admission: RE | Admit: 2021-05-16 | Payer: Medicare HMO | Source: Ambulatory Visit

## 2021-05-19 ENCOUNTER — Ambulatory Visit: Admission: RE | Admit: 2021-05-19 | Payer: Medicare HMO | Source: Ambulatory Visit

## 2021-06-02 ENCOUNTER — Ambulatory Visit
Admission: RE | Admit: 2021-06-02 | Discharge: 2021-06-02 | Disposition: A | Discharge status: Home / Self Care | Payer: Medicare HMO | Source: Ambulatory Visit | Attending: Internal Medicine | Admitting: Internal Medicine

## 2021-06-02 ENCOUNTER — Other Ambulatory Visit: Payer: Self-pay

## 2021-06-02 DIAGNOSIS — G319 Degenerative disease of nervous system, unspecified: Secondary | ICD-10-CM | POA: Diagnosis not present

## 2021-06-02 DIAGNOSIS — M542 Cervicalgia: Secondary | ICD-10-CM | POA: Insufficient documentation

## 2021-06-02 DIAGNOSIS — F0280 Dementia in other diseases classified elsewhere without behavioral disturbance: Secondary | ICD-10-CM | POA: Insufficient documentation

## 2021-06-02 DIAGNOSIS — R519 Headache, unspecified: Secondary | ICD-10-CM | POA: Diagnosis not present

## 2021-06-02 DIAGNOSIS — F0150 Vascular dementia without behavioral disturbance: Secondary | ICD-10-CM | POA: Diagnosis not present

## 2021-06-02 DIAGNOSIS — R441 Visual hallucinations: Secondary | ICD-10-CM | POA: Diagnosis not present

## 2021-06-02 DIAGNOSIS — G309 Alzheimer's disease, unspecified: Secondary | ICD-10-CM | POA: Diagnosis not present

## 2021-06-20 ENCOUNTER — Ambulatory Visit: Payer: Medicare HMO | Admitting: Cardiovascular Disease

## 2021-06-21 DIAGNOSIS — M542 Cervicalgia: Secondary | ICD-10-CM | POA: Diagnosis not present

## 2021-06-21 DIAGNOSIS — M79671 Pain in right foot: Secondary | ICD-10-CM | POA: Diagnosis not present

## 2021-07-03 ENCOUNTER — Inpatient Hospital Stay: Payer: Medicare HMO

## 2021-07-03 ENCOUNTER — Emergency Department: Payer: Medicare HMO

## 2021-07-03 ENCOUNTER — Inpatient Hospital Stay
Admission: EM | Admit: 2021-07-03 | Discharge: 2021-08-06 | DRG: 086 | Disposition: E | Payer: Medicare HMO | Attending: Internal Medicine | Admitting: Internal Medicine

## 2021-07-03 ENCOUNTER — Other Ambulatory Visit: Payer: Self-pay

## 2021-07-03 DIAGNOSIS — F02C18 Dementia in other diseases classified elsewhere, severe, with other behavioral disturbance: Secondary | ICD-10-CM | POA: Diagnosis present

## 2021-07-03 DIAGNOSIS — G319 Degenerative disease of nervous system, unspecified: Secondary | ICD-10-CM | POA: Diagnosis not present

## 2021-07-03 DIAGNOSIS — R402362 Coma scale, best motor response, obeys commands, at arrival to emergency department: Secondary | ICD-10-CM | POA: Diagnosis present

## 2021-07-03 DIAGNOSIS — E039 Hypothyroidism, unspecified: Secondary | ICD-10-CM | POA: Diagnosis present

## 2021-07-03 DIAGNOSIS — S0990XA Unspecified injury of head, initial encounter: Secondary | ICD-10-CM

## 2021-07-03 DIAGNOSIS — Z809 Family history of malignant neoplasm, unspecified: Secondary | ICD-10-CM | POA: Diagnosis not present

## 2021-07-03 DIAGNOSIS — R410 Disorientation, unspecified: Secondary | ICD-10-CM

## 2021-07-03 DIAGNOSIS — I8500 Esophageal varices without bleeding: Secondary | ICD-10-CM | POA: Diagnosis present

## 2021-07-03 DIAGNOSIS — F02818 Dementia in other diseases classified elsewhere, unspecified severity, with other behavioral disturbance: Secondary | ICD-10-CM | POA: Diagnosis not present

## 2021-07-03 DIAGNOSIS — Z66 Do not resuscitate: Secondary | ICD-10-CM | POA: Diagnosis present

## 2021-07-03 DIAGNOSIS — W19XXXA Unspecified fall, initial encounter: Secondary | ICD-10-CM

## 2021-07-03 DIAGNOSIS — F02C3 Dementia in other diseases classified elsewhere, severe, with mood disturbance: Secondary | ICD-10-CM | POA: Diagnosis present

## 2021-07-03 DIAGNOSIS — E1122 Type 2 diabetes mellitus with diabetic chronic kidney disease: Secondary | ICD-10-CM | POA: Diagnosis present

## 2021-07-03 DIAGNOSIS — Z781 Physical restraint status: Secondary | ICD-10-CM

## 2021-07-03 DIAGNOSIS — Z7984 Long term (current) use of oral hypoglycemic drugs: Secondary | ICD-10-CM

## 2021-07-03 DIAGNOSIS — N1831 Chronic kidney disease, stage 3a: Secondary | ICD-10-CM | POA: Diagnosis present

## 2021-07-03 DIAGNOSIS — E785 Hyperlipidemia, unspecified: Secondary | ICD-10-CM | POA: Diagnosis present

## 2021-07-03 DIAGNOSIS — S065X0A Traumatic subdural hemorrhage without loss of consciousness, initial encounter: Principal | ICD-10-CM | POA: Diagnosis present

## 2021-07-03 DIAGNOSIS — I10 Essential (primary) hypertension: Secondary | ICD-10-CM | POA: Diagnosis not present

## 2021-07-03 DIAGNOSIS — S06360A Traumatic hemorrhage of cerebrum, unspecified, without loss of consciousness, initial encounter: Secondary | ICD-10-CM | POA: Diagnosis not present

## 2021-07-03 DIAGNOSIS — R402242 Coma scale, best verbal response, confused conversation, at arrival to emergency department: Secondary | ICD-10-CM | POA: Diagnosis present

## 2021-07-03 DIAGNOSIS — F0390 Unspecified dementia without behavioral disturbance: Secondary | ICD-10-CM | POA: Diagnosis not present

## 2021-07-03 DIAGNOSIS — Z79890 Hormone replacement therapy (postmenopausal): Secondary | ICD-10-CM

## 2021-07-03 DIAGNOSIS — F02C11 Dementia in other diseases classified elsewhere, severe, with agitation: Secondary | ICD-10-CM | POA: Diagnosis not present

## 2021-07-03 DIAGNOSIS — R0603 Acute respiratory distress: Secondary | ICD-10-CM | POA: Diagnosis not present

## 2021-07-03 DIAGNOSIS — Z8673 Personal history of transient ischemic attack (TIA), and cerebral infarction without residual deficits: Secondary | ICD-10-CM

## 2021-07-03 DIAGNOSIS — N179 Acute kidney failure, unspecified: Secondary | ICD-10-CM | POA: Diagnosis not present

## 2021-07-03 DIAGNOSIS — Y92019 Unspecified place in single-family (private) house as the place of occurrence of the external cause: Secondary | ICD-10-CM | POA: Diagnosis not present

## 2021-07-03 DIAGNOSIS — G4733 Obstructive sleep apnea (adult) (pediatric): Secondary | ICD-10-CM | POA: Diagnosis present

## 2021-07-03 DIAGNOSIS — Z6831 Body mass index (BMI) 31.0-31.9, adult: Secondary | ICD-10-CM | POA: Diagnosis not present

## 2021-07-03 DIAGNOSIS — I6200 Nontraumatic subdural hemorrhage, unspecified: Secondary | ICD-10-CM | POA: Diagnosis present

## 2021-07-03 DIAGNOSIS — R402142 Coma scale, eyes open, spontaneous, at arrival to emergency department: Secondary | ICD-10-CM | POA: Diagnosis present

## 2021-07-03 DIAGNOSIS — I6782 Cerebral ischemia: Secondary | ICD-10-CM | POA: Diagnosis not present

## 2021-07-03 DIAGNOSIS — I4891 Unspecified atrial fibrillation: Secondary | ICD-10-CM | POA: Diagnosis present

## 2021-07-03 DIAGNOSIS — S065XAA Traumatic subdural hemorrhage with loss of consciousness status unknown, initial encounter: Secondary | ICD-10-CM

## 2021-07-03 DIAGNOSIS — Z20822 Contact with and (suspected) exposure to covid-19: Secondary | ICD-10-CM | POA: Diagnosis not present

## 2021-07-03 DIAGNOSIS — I129 Hypertensive chronic kidney disease with stage 1 through stage 4 chronic kidney disease, or unspecified chronic kidney disease: Secondary | ICD-10-CM | POA: Diagnosis present

## 2021-07-03 DIAGNOSIS — N183 Chronic kidney disease, stage 3 unspecified: Secondary | ICD-10-CM | POA: Diagnosis present

## 2021-07-03 DIAGNOSIS — Z515 Encounter for palliative care: Secondary | ICD-10-CM | POA: Diagnosis not present

## 2021-07-03 DIAGNOSIS — E440 Moderate protein-calorie malnutrition: Secondary | ICD-10-CM | POA: Diagnosis not present

## 2021-07-03 DIAGNOSIS — Z880 Allergy status to penicillin: Secondary | ICD-10-CM

## 2021-07-03 DIAGNOSIS — Z7901 Long term (current) use of anticoagulants: Secondary | ICD-10-CM | POA: Diagnosis not present

## 2021-07-03 DIAGNOSIS — I639 Cerebral infarction, unspecified: Secondary | ICD-10-CM | POA: Diagnosis present

## 2021-07-03 DIAGNOSIS — E1129 Type 2 diabetes mellitus with other diabetic kidney complication: Secondary | ICD-10-CM | POA: Diagnosis present

## 2021-07-03 DIAGNOSIS — W1830XA Fall on same level, unspecified, initial encounter: Secondary | ICD-10-CM | POA: Diagnosis present

## 2021-07-03 DIAGNOSIS — G309 Alzheimer's disease, unspecified: Secondary | ICD-10-CM | POA: Diagnosis not present

## 2021-07-03 DIAGNOSIS — M2602 Maxillary hypoplasia: Secondary | ICD-10-CM | POA: Diagnosis not present

## 2021-07-03 DIAGNOSIS — G4730 Sleep apnea, unspecified: Secondary | ICD-10-CM | POA: Diagnosis present

## 2021-07-03 DIAGNOSIS — Z79899 Other long term (current) drug therapy: Secondary | ICD-10-CM

## 2021-07-03 DIAGNOSIS — N1832 Chronic kidney disease, stage 3b: Secondary | ICD-10-CM | POA: Diagnosis not present

## 2021-07-03 LAB — CBC WITH DIFFERENTIAL/PLATELET
Abs Immature Granulocytes: 0.01 10*3/uL (ref 0.00–0.07)
Basophils Absolute: 0 10*3/uL (ref 0.0–0.1)
Basophils Relative: 1 %
Eosinophils Absolute: 0.1 10*3/uL (ref 0.0–0.5)
Eosinophils Relative: 2 %
HCT: 37.5 % — ABNORMAL LOW (ref 39.0–52.0)
Hemoglobin: 12.6 g/dL — ABNORMAL LOW (ref 13.0–17.0)
Immature Granulocytes: 0 %
Lymphocytes Relative: 20 %
Lymphs Abs: 1.1 10*3/uL (ref 0.7–4.0)
MCH: 29.2 pg (ref 26.0–34.0)
MCHC: 33.6 g/dL (ref 30.0–36.0)
MCV: 87 fL (ref 80.0–100.0)
Monocytes Absolute: 0.5 10*3/uL (ref 0.1–1.0)
Monocytes Relative: 9 %
Neutro Abs: 3.7 10*3/uL (ref 1.7–7.7)
Neutrophils Relative %: 68 %
Platelets: 146 10*3/uL — ABNORMAL LOW (ref 150–400)
RBC: 4.31 MIL/uL (ref 4.22–5.81)
RDW: 13.5 % (ref 11.5–15.5)
WBC: 5.3 10*3/uL (ref 4.0–10.5)
nRBC: 0 % (ref 0.0–0.2)

## 2021-07-03 LAB — RESP PANEL BY RT-PCR (FLU A&B, COVID) ARPGX2
Influenza A by PCR: NEGATIVE
Influenza B by PCR: NEGATIVE
SARS Coronavirus 2 by RT PCR: NEGATIVE

## 2021-07-03 LAB — BASIC METABOLIC PANEL
Anion gap: 7 (ref 5–15)
BUN: 24 mg/dL — ABNORMAL HIGH (ref 8–23)
CO2: 26 mmol/L (ref 22–32)
Calcium: 9.4 mg/dL (ref 8.9–10.3)
Chloride: 106 mmol/L (ref 98–111)
Creatinine, Ser: 1.55 mg/dL — ABNORMAL HIGH (ref 0.61–1.24)
GFR, Estimated: 46 mL/min — ABNORMAL LOW (ref >60)
Glucose, Bld: 126 mg/dL — ABNORMAL HIGH (ref 70–99)
Potassium: 3.6 mmol/L (ref 3.5–5.1)
Sodium: 139 mmol/L (ref 135–145)

## 2021-07-03 MED ORDER — AMLODIPINE BESYLATE 5 MG PO TABS
5.0000 mg | ORAL_TABLET | Freq: Every day | ORAL | Status: DC
  Administered 2021-07-03 – 2021-07-09 (×5): 5 mg via ORAL
  Filled 2021-07-03 (×6): qty 1

## 2021-07-03 MED ORDER — SODIUM CHLORIDE 0.9% FLUSH
3.0000 mL | Freq: Two times a day (BID) | INTRAVENOUS | Status: DC
  Administered 2021-07-03 – 2021-07-09 (×6): 3 mL via INTRAVENOUS

## 2021-07-03 MED ORDER — HALOPERIDOL LACTATE 5 MG/ML IJ SOLN
2.0000 mg | Freq: Four times a day (QID) | INTRAMUSCULAR | Status: DC | PRN
  Administered 2021-07-03: 22:00:00 2 mg via INTRAVENOUS
  Filled 2021-07-03 (×2): qty 1

## 2021-07-03 MED ORDER — SODIUM CHLORIDE 0.9% FLUSH: 3.0000 mL | INTRAVENOUS | Status: DC | PRN

## 2021-07-03 MED ORDER — PANTOPRAZOLE SODIUM 40 MG PO TBEC
40.0000 mg | DELAYED_RELEASE_TABLET | Freq: Every day | ORAL | Status: DC
  Administered 2021-07-03 – 2021-07-09 (×4): 40 mg via ORAL
  Filled 2021-07-03 (×6): qty 1

## 2021-07-03 MED ORDER — QUETIAPINE FUMARATE 25 MG PO TABS
25.0000 mg | ORAL_TABLET | Freq: Every day | ORAL | Status: DC
  Administered 2021-07-03 – 2021-07-09 (×6): 25 mg via ORAL
  Filled 2021-07-03 (×6): qty 1

## 2021-07-03 MED ORDER — LORAZEPAM 2 MG/ML IJ SOLN
INTRAMUSCULAR | Status: AC
  Filled 2021-07-03: qty 1

## 2021-07-03 MED ORDER — SODIUM CHLORIDE 0.9 % IV SOLN: 250.0000 mL | INTRAVENOUS | Status: DC | PRN

## 2021-07-03 MED ORDER — HALOPERIDOL LACTATE 5 MG/ML IJ SOLN
5.0000 mg | Freq: Once | INTRAMUSCULAR | Status: AC
  Administered 2021-07-03: 20:00:00 5 mg via INTRAMUSCULAR
  Filled 2021-07-03: qty 1

## 2021-07-03 MED ORDER — METFORMIN HCL 500 MG PO TABS
1000.0000 mg | ORAL_TABLET | Freq: Two times a day (BID) | ORAL | Status: DC
  Administered 2021-07-04 – 2021-07-06 (×3): 1000 mg via ORAL
  Filled 2021-07-03 (×5): qty 2

## 2021-07-03 MED ORDER — PROPRANOLOL HCL 20 MG PO TABS
10.0000 mg | ORAL_TABLET | Freq: Two times a day (BID) | ORAL | Status: DC
  Administered 2021-07-03 – 2021-07-09 (×9): 10 mg via ORAL
  Filled 2021-07-03 (×10): qty 1

## 2021-07-03 MED ORDER — LORAZEPAM 2 MG/ML IJ SOLN
2.0000 mg | INTRAMUSCULAR | Status: AC | PRN
  Administered 2021-07-03 – 2021-07-04 (×2): 2 mg via INTRAVENOUS

## 2021-07-03 MED ORDER — ATORVASTATIN CALCIUM 20 MG PO TABS
40.0000 mg | ORAL_TABLET | Freq: Every day | ORAL | Status: DC
  Administered 2021-07-04 – 2021-07-09 (×3): 40 mg via ORAL
  Filled 2021-07-03 (×5): qty 2

## 2021-07-03 MED ORDER — LEVOTHYROXINE SODIUM 50 MCG PO TABS
25.0000 ug | ORAL_TABLET | Freq: Every day | ORAL | Status: DC
  Administered 2021-07-05 – 2021-07-09 (×3): 25 ug via ORAL
  Filled 2021-07-03 (×4): qty 1

## 2021-07-03 MED ORDER — LOSARTAN POTASSIUM 50 MG PO TABS
100.0000 mg | ORAL_TABLET | Freq: Every day | ORAL | Status: DC
  Administered 2021-07-04 – 2021-07-09 (×3): 100 mg via ORAL
  Filled 2021-07-03 (×5): qty 2

## 2021-07-03 NOTE — ED Triage Notes (Signed)
Pt comes via EMs with c/o fall after getting out of car. Pt was in car with wife and  they were stopped when pt got out. Pt started running and fell. Pt got back up and ran again.   Pt has dementia. Pt didn't hit head. Pt is on blood thinners. CBG-187 abrasion to left eyebrow. VSS

## 2021-07-03 NOTE — ED Notes (Signed)
ED Provider at bedside. 

## 2021-07-03 NOTE — ED Notes (Signed)
Pt assisted to stretcher. Disoriented. Wife reports normal d/t dementia. NAD noted

## 2021-07-03 NOTE — H&P (Addendum)
History and Physical    Tanner Oconnell KGY:[id number] DOB: 04/11/1946 DOA: 06/13/2021  PCP: Tracie Harrier, MD  Patient coming from: home   Chief Complaint: fall  HPI: Tanner Oconnell is a 75 y.o. male with medical history significant for advanced alzheimer's dementia, cva, a fib, dm, htn, hypothyroid, esophageal varices, who presents with the above.  Wife reports behavior worsening at home. He runs away, can become violent. Today he ran away, when she retrieved him he jumped out of the car and ran off, falling down and hitting head. She does not feel safe caring for him at home. No focal neurologic symptoms. Is on anticoag.  ED Course:   CTH shows small subdural hematoma. Neurosurg advises repeat imaging in 6 hours. Wife reports unsafe to take patient home.  Review of Systems: As per HPI otherwise 10 point review of systems negative.    Past Medical History:  Diagnosis Date   Dementia ([place])    Diabetes mellitus without complication ([place])    History of kidney stones    Hyperlipidemia    Hypertension    Hypothyroidism    Obstructive sleep apnea    refuses to wear cpap   Thrombocytopenia (HCC)    Thyroid disease    Hypothyroidism    Past Surgical History:  Procedure Laterality Date   BONE MARROW BIOPSY     COLONOSCOPY     COLONOSCOPY WITH PROPOFOL N/A 02/24/2020   Procedure: COLONOSCOPY WITH PROPOFOL;  Surgeon: Robert Bellow, MD;  Location: [place];  Service: Gastroenterology;  Laterality: N/A;   ESOPHAGOGASTRODUODENOSCOPY N/A 02/24/2020   Procedure: ESOPHAGOGASTRODUODENOSCOPY (EGD);  Surgeon: Robert Bellow, MD;  Location: [facility] ENDOSCOPY;  Service: Gastroenterology;  Laterality: N/A;     reports that he has never smoked. He quit smokeless tobacco use about 11 years ago.  His smokeless tobacco use included chew. He reports that he does not drink alcohol and does not use drugs.  Allergies  Allergen Reactions   Penicillins Rash    Unknown reaction Red  bumps.    Family History  Problem Relation Age of Onset   Cancer Mother    Cancer Father     Prior to Admission medications   Medication Sig Start Date End Date Taking? Authorizing Provider  amLODipine (NORVASC) 5 MG tablet TAKE 1 TABLET ONCE DAILY 06/19/19   [provider]  ascorbic acid (VITAMIN C) 1000 MG tablet Take 1,000 mg by mouth daily.    [provider]  atorvastatin (LIPITOR) 40 MG tablet Take 1 tablet (40 mg total) by mouth daily. 04/07/21   Wellington Hampshire, MD  CALCIUM-VITAMIN D PO Take by mouth.    [provider]  docusate sodium (COLACE) 100 MG capsule Take 1 capsule (100 mg total) by mouth 2 (two) times daily. For constipation. Patient not taking: Reported on 02/16/2021 10/06/19   Coral Spikes, DO  donepezil (ARICEPT) 10 MG tablet Take 10 mg by mouth daily. 09/01/19   [provider]  fenofibrate (TRICOR) 145 MG tablet Take 1 tablet by mouth daily. 08/16/20 08/16/21  [provider]  gabapentin (NEURONTIN) 100 MG capsule Take 1 capsule by mouth 2 (two) times daily.    [provider]  glimepiride (AMARYL) 1 MG tablet Take 1 tablet by mouth in the morning. 08/16/20 02/16/21  [provider]  glimepiride (AMARYL) 2 MG tablet Take 2 mg by mouth at bedtime. 08/16/20 02/16/21  [provider]  levothyroxine (SYNTHROID) 25 MCG tablet Take 25 mcg by  mouth daily. 09/01/19   [provider]  losartan-hydrochlorothiazide (HYZAAR) 100-25 MG tablet Take 1 tablet by mouth daily. 09/01/19   [provider]  memantine (NAMENDA) 10 MG tablet Take 10 mg by mouth 2 (two) times daily. 09/15/19   [provider]  metFORMIN (GLUCOPHAGE) 1000 MG tablet Take 1,000 mg by mouth 2 (two) times daily. 09/01/19   [provider]  Multiple Vitamin (MULTI-VITAMIN) tablet Take by mouth.    [provider]  Omega-3 Fatty Acids (FISH OIL) 1000 MG CAPS Take by mouth. Patient not taking: Reported on  02/16/2021    [provider]  omeprazole (PRILOSEC) 20 MG capsule Take 20 mg by mouth daily. 11/22/20   [provider]  propranolol (INDERAL) 10 MG tablet Take 10 mg by mouth 2 (two) times daily. 08/31/19   [provider]  QUEtiapine (SEROQUEL) 25 MG tablet Take 25 mg by mouth at bedtime. 12/28/20   [provider]  rivaroxaban (XARELTO) 20 MG TABS tablet Take 1 tablet (20 mg total) by mouth daily with supper. 04/07/21   Wellington Hampshire, MD  sertraline (ZOLOFT) 100 MG tablet Take 100 mg by mouth daily. 09/07/19   [provider]  vitamin B-12 (CYANOCOBALAMIN) 1000 MCG tablet Take 1,000 mcg by mouth daily. Patient not taking: Reported on 02/16/2021    [provider]    Physical Exam: Vitals:   06/24/2021 1304 06/18/2021 1308 07/01/2021 1553  BP: (!) 155/141  (!) 146/131  Pulse: (!) 107  97  Resp: 15  16  Temp:   98.1 F (36.7 C)  TempSrc: Oral  Axillary  SpO2: 97%  97%  Weight:  105.2 kg   Height:  6' (1.829 m)     Constitutional: No acute distress Head: superficial non-bleeding abrasion and laceration left anterior scalp Eyes: Conjunctiva clear ENM: Moist mucous membranes. Normal dentition.  Neck: Supple Respiratory: Clear to auscultation bilaterally, no wheezing/rales/rhonchi. Normal respiratory effort. No accessory muscle use. . Cardiovascular: irreg irreg Abdomen: Non-tender, non-distended. No masses. No rebound or guarding. Positive bowel sounds. Musculoskeletal: No joint deformity upper and lower extremities. Normal ROM, no contractures. Normal muscle tone.  Skin: No rashes, lesions, or ulcers.  Extremities: No peripheral edema. Palpable peripheral pulses. Neurologic: Alert, moving all 4 extremities. Cn2-12 grossly intact Psychiatric: demented   Labs on Admission: I have personally reviewed following labs and imaging studies  CBC: No results for input(s): WBC, NEUTROABS, HGB, HCT, MCV, PLT in the last 168 hours. Basic  Metabolic Panel: No results for input(s): NA, K, CL, CO2, GLUCOSE, BUN, CREATININE, CALCIUM, MG, PHOS in the last 168 hours. GFR: CrCl cannot be calculated (Patient's most recent lab result is older than the maximum 21 days allowed.). Liver Function Tests: No results for input(s): AST, ALT, ALKPHOS, BILITOT, PROT, ALBUMIN in the last 168 hours. No results for input(s): LIPASE, AMYLASE in the last 168 hours. No results for input(s): AMMONIA in the last 168 hours. Coagulation Profile: No results for input(s): INR, PROTIME in the last 168 hours. Cardiac Enzymes: No results for input(s): CKTOTAL, CKMB, CKMBINDEX, TROPONINI in the last 168 hours. BNP (last 3 results) No results for input(s): PROBNP in the last 8760 hours. HbA1C: No results for input(s): HGBA1C in the last 72 hours. CBG: No results for input(s): GLUCAP in the last 168 hours. Lipid Profile: No results for input(s): CHOL, HDL, LDLCALC, TRIG, CHOLHDL, LDLDIRECT in the last 72 hours. Thyroid Function Tests: No results for input(s): TSH, T4TOTAL, FREET4, T3FREE, THYROIDAB in  the last 72 hours. Anemia Panel: No results for input(s): VITAMINB12, FOLATE, FERRITIN, TIBC, IRON, RETICCTPCT in the last 72 hours. Urine analysis:    Component Value Date/Time   COLORURINE YELLOW (A) 02/02/2021 1528   APPEARANCEUR HAZY (A) 02/02/2021 1528   LABSPEC 1.021 02/02/2021 1528   PHURINE 6.0 02/02/2021 1528   GLUCOSEU NEGATIVE 02/02/2021 1528   HGBUR NEGATIVE 02/02/2021 1528   BILIRUBINUR NEGATIVE 02/02/2021 1528   KETONESUR NEGATIVE 02/02/2021 1528   PROTEINUR NEGATIVE 02/02/2021 1528   NITRITE NEGATIVE 02/02/2021 1528   LEUKOCYTESUR SMALL (A) 02/02/2021 1528    Radiological Exams on Admission: CT HEAD WO CONTRAST (5MM)  Result Date: 06/30/2021 CLINICAL DATA:  Status post fall. History of dementia. On blood thinners. EXAM: CT HEAD WITHOUT CONTRAST CT CERVICAL SPINE WITHOUT CONTRAST TECHNIQUE: Multidetector CT imaging of the head and  cervical spine was performed following the standard protocol without intravenous contrast. Multiplanar CT image reconstructions of the cervical spine were also generated. COMPARISON:  None. FINDINGS: CT HEAD FINDINGS Brain: Generalized parenchymal volume loss with commensurate dilatation of the ventricles and sulci. Mild chronic small vessel ischemic changes within the bilateral periventricular and subcortical white matter regions. Trace thin extra-axial hemorrhage overlying the RIGHT frontotemporal lobe (series 3, images 12 through 14), measuring approximately 1-2 mm greatest thickness. No additional extra-axial hemorrhage. No parenchymal or intraventricular hemorrhage is identified. No mass effect, midline shift or herniation. Vascular: Chronic calcified atherosclerotic changes of the large vessels at the skull base. No unexpected hyperdense vessel. Skull: Normal. Negative for fracture or focal lesion. Sinuses/Orbits: No acute finding. Other: Scalp edema overlying the frontal bones. No circumscribed soft tissue hematoma. No underlying fracture. CT CERVICAL SPINE FINDINGS Alignment: No evidence of acute vertebral body subluxation. Skull base and vertebrae: No fracture line or displaced fracture fragment. Soft tissues and spinal canal: No prevertebral fluid or swelling. No visible canal hematoma. Disc levels: Chronic degenerative spondylosis throughout the cervical spine, mild to moderate in degree, with associated disc space narrowings and osseous spurring. Upper chest: Negative. Other: Bilateral carotid atherosclerosis. IMPRESSION: 1. Trace thin extra-axial hemorrhage overlying the RIGHT frontotemporal lobe, presumably acute, measuring approximately 1-2 mm greatest thickness. No associated mass effect. No additional intracranial hemorrhage. 2. Scalp edema overlying the frontal bones. No underlying fracture. 3. No fracture or acute subluxation within the cervical spine. 4. Chronic degenerative spondylosis of the  cervical spine, mild to moderate in degree. 5. Carotid atherosclerosis. Critical Value/emergent results were called by telephone at the time of interpretation on 06/20/2021 at 1:40 pm to provider PHILLIP STAFFORD , who verbally acknowledged these results. Electronically Signed   By: Franki Cabot M.D.   On: 07/01/2021 13:40   CT Cervical Spine Wo Contrast  Result Date: 06/27/2021 CLINICAL DATA:  Status post fall. History of dementia. On blood thinners. EXAM: CT HEAD WITHOUT CONTRAST CT CERVICAL SPINE WITHOUT CONTRAST TECHNIQUE: Multidetector CT imaging of the head and cervical spine was performed following the standard protocol without intravenous contrast. Multiplanar CT image reconstructions of the cervical spine were also generated. COMPARISON:  None. FINDINGS: CT HEAD FINDINGS Brain: Generalized parenchymal volume loss with commensurate dilatation of the ventricles and sulci. Mild chronic small vessel ischemic changes within the bilateral periventricular and subcortical white matter regions. Trace thin extra-axial hemorrhage overlying the RIGHT frontotemporal lobe (series 3, images 12 through 14), measuring approximately 1-2 mm greatest thickness. No additional extra-axial hemorrhage. No parenchymal or intraventricular hemorrhage is identified. No mass effect, midline shift or herniation. Vascular: Chronic calcified atherosclerotic changes of the  large vessels at the skull base. No unexpected hyperdense vessel. Skull: Normal. Negative for fracture or focal lesion. Sinuses/Orbits: No acute finding. Other: Scalp edema overlying the frontal bones. No circumscribed soft tissue hematoma. No underlying fracture. CT CERVICAL SPINE FINDINGS Alignment: No evidence of acute vertebral body subluxation. Skull base and vertebrae: No fracture line or displaced fracture fragment. Soft tissues and spinal canal: No prevertebral fluid or swelling. No visible canal hematoma. Disc levels: Chronic degenerative spondylosis  throughout the cervical spine, mild to moderate in degree, with associated disc space narrowings and osseous spurring. Upper chest: Negative. Other: Bilateral carotid atherosclerosis. IMPRESSION: 1. Trace thin extra-axial hemorrhage overlying the RIGHT frontotemporal lobe, presumably acute, measuring approximately 1-2 mm greatest thickness. No associated mass effect. No additional intracranial hemorrhage. 2. Scalp edema overlying the frontal bones. No underlying fracture. 3. No fracture or acute subluxation within the cervical spine. 4. Chronic degenerative spondylosis of the cervical spine, mild to moderate in degree. 5. Carotid atherosclerosis. Critical Value/emergent results were called by telephone at the time of interpretation on 06/18/2021 at 1:40 pm to provider PHILLIP STAFFORD , who verbally acknowledged these results. Electronically Signed   By: Franki Cabot M.D.   On: 07/05/2021 13:40    EKG: pending  Assessment/Plan Principal Problem:   Subdural hemorrhage (HCC) Active Problems:   Stroke ([place])   Dementia (HCC)   Type II diabetes mellitus with renal manifestations ([place])   Hypothyroidism   Hypertension   New onset atrial fibrillation (HCC)   CKD (chronic kidney disease), stage IIIa   Sleep apnea   # Subdural hematoma Small, after mechanical fall. Dr. Lacinda Axon of neurosurgery advises repeat imaging in 6 hours - repeat ct ordered - hold anticoagulation - neuro checks - fall precautions  # Advanced alzheimer's dementia w/ behavioral disturbance Wife says can't care for him, he is also violent at home. She is in process of getting him into snf. Here not violent, is redirectable - resume qhs seroquel - toc consult  # HTN Here bp mildly elevated - resume home amlodipine, losartan - f/u labs (pending at time of admit)  # Atrial fibrillation Rate wnl - hold anticoagulation. Is not on rate control at home - f/u ekg; place on tele  # T2DM Here glucose pending - hold home glimep;  cont metformin - daily fasting glucose for now, will avoid sliding scale if possible given dementia w/ behavioral response  # Hypothyroid - cont home levo  # Hx CVA - cont home statin  # Hx esophageal varices Possible cirrhosis - noted, does not appear decompensated - f/u lfts  DVT prophylaxis: SCDs Code Status: dnr  Family Communication: wife updated @ bedside  Consults called: neurosurg   Level of care: Med-Surg Status is: Inpatient  Remains inpatient appropriate because: unsafe d/c plan        Desma Maxim MD [facility] Pager [phone]  If 7PM-7AM, please contact night-coverage [email or web address] Password [facility]  06/26/2021, 6:25 PM

## 2021-07-03 NOTE — Progress Notes (Signed)
Patient arrived to the unit room 106 around 2130. Patient made multiple attempts to exit bed despite verbal redirection. Decision made to move patient to room 120, closer to nurse's station.   Patient very agitated wanting to leave. Given scheduled seroquel. Refusing to stay in bed, pushed past two staff members to exit room, threw a cup of ice water at staff, attempted to enter several patients' rooms, made two attempts to get through door to stairway. It required 6 staff members to prevent patient from leaving and get him into a wheelchair. Given prn haldol. Security paged.  Patient drowsy and dozing off and on in bed for about 30 minutes, then attempting to get OOB again. Patient had removed tele leads and placed on counter. Refusing to allow for reapplication of tele leads.   2300: patient OOB searching window for an exit. Pushed past this nurse to exit room. I walked with patient around unit 3 times hoping to tire him while [employer] on call provider Dr. Tobie Poet. There is no staff to sit with patient and he has not responded to other measures. This was relayed to provider by [employer]. Ativan ordered and given IV. Patient is currently in bed with bed alarm on, bed in low and locked position. Fall mat on the floor, non-slip socks in place.

## 2021-07-03 NOTE — ED Provider Notes (Addendum)
[facility] Emergency Department Provider Note ____________________________________________   Event Date/Time   First MD Initiated Contact with Patient 06/18/2021 1409     (approximate)  I have reviewed the triage vital signs and the nursing notes.  HISTORY  Chief Complaint Fall   HPI Tanner Oconnell is a 75 y.o. malewho presents to the ED for evaluation of a fall.   Chart review indicates also has dementia, HTN, HLD and DM.  Anticoagulated on Xarelto.  Patient lives at home with his wife.  Advanced dementia.  Independently ambulatory, or with a cane or walker though he often ignores these.  Wife reports that he was wandering outside of the house today around their neighborhood, so she was able to go pick him up in the car.  After picking him up in the car, she reports that he jumped out and bolted over an embankment and had a mechanical fall that she witnessed.  He apparently popped back up and kept running before she was able to get him back to the car.  She brings him to the ED for evaluation.  She reports that she is planning to have him placed at the [facility] this week.  They apparently have a bed available for him, but the previous resident had COVID-19, so they're asking 2 days for this bed to be cleaned.  She reports concern about taking him back home.  Past Medical History:  Diagnosis Date   Dementia ([place])    Diabetes mellitus without complication ([place])    History of kidney stones    Hyperlipidemia    Hypertension    Hypothyroidism    Obstructive sleep apnea    refuses to wear cpap   Thrombocytopenia ([place])    Thyroid disease    Hypothyroidism    Patient Active Problem List   Diagnosis Date Noted   Sleep apnea 06/27/2021   Subdural hemorrhage ([place]) 06/13/2021   Acute CVA (cerebrovascular accident) ([place]) 02/03/2021   Stroke ([place]) 02/02/2021   Dementia ([place])    Type II diabetes mellitus with renal manifestations ([place])     Hyperlipidemia    Hypothyroidism    Hypertension    Elevated troponin    New onset atrial fibrillation (HCC)    Atrial fibrillation with RVR (HCC)    Depression    CKD (chronic kidney disease), stage IIIa    Atrial fibrillation, new onset ([place])     Past Surgical History:  Procedure Laterality Date   BONE MARROW BIOPSY     COLONOSCOPY     COLONOSCOPY WITH PROPOFOL N/A 02/24/2020   Procedure: COLONOSCOPY WITH PROPOFOL;  Surgeon: Robert Bellow, MD;  Location: [facility] ENDOSCOPY;  Service: Gastroenterology;  Laterality: N/A;   ESOPHAGOGASTRODUODENOSCOPY N/A 02/24/2020   Procedure: ESOPHAGOGASTRODUODENOSCOPY (EGD);  Surgeon: Robert Bellow, MD;  Location: [facility] ENDOSCOPY;  Service: Gastroenterology;  Laterality: N/A;    Prior to Admission medications   Medication Sig Start Date End Date Taking? Authorizing Provider  amLODipine (NORVASC) 5 MG tablet TAKE 1 TABLET ONCE DAILY 06/19/19   [provider]  ascorbic acid (VITAMIN C) 1000 MG tablet Take 1,000 mg by mouth daily.    [provider]  atorvastatin (LIPITOR) 40 MG tablet Take 1 tablet (40 mg total) by mouth daily. 04/07/21   Wellington Hampshire, MD  CALCIUM-VITAMIN D PO Take by mouth.    [provider]  docusate sodium (COLACE) 100 MG capsule Take 1 capsule (100 mg total) by mouth 2 (two) times  daily. For constipation. Patient not taking: Reported on 02/16/2021 10/06/19   Coral Spikes, DO  donepezil (ARICEPT) 10 MG tablet Take 10 mg by mouth daily. 09/01/19   [provider]  fenofibrate (TRICOR) 145 MG tablet Take 1 tablet by mouth daily. 08/16/20 08/16/21  [provider]  gabapentin (NEURONTIN) 100 MG capsule Take 1 capsule by mouth 2 (two) times daily.    [provider]  glimepiride (AMARYL) 1 MG tablet Take 1 tablet by mouth in the morning. 08/16/20 02/16/21  [provider]  glimepiride (AMARYL) 2 MG tablet Take 2 mg by mouth at bedtime. 08/16/20 02/16/21  [provider]  levothyroxine (SYNTHROID) 25 MCG tablet Take 25 mcg by mouth daily. 09/01/19   [provider]  losartan-hydrochlorothiazide (HYZAAR) 100-25 MG tablet Take 1 tablet by mouth daily. 09/01/19   [provider]  memantine (NAMENDA) 10 MG tablet Take 10 mg by mouth 2 (two) times daily. 09/15/19   [provider]  metFORMIN (GLUCOPHAGE) 1000 MG tablet Take 1,000 mg by mouth 2 (two) times daily. 09/01/19   [provider]  Multiple Vitamin (MULTI-VITAMIN) tablet Take by mouth.    [provider]  Omega-3 Fatty Acids (FISH OIL) 1000 MG CAPS Take by mouth. Patient not taking: Reported on 02/16/2021    [provider]  omeprazole (PRILOSEC) 20 MG capsule Take 20 mg by mouth daily. 11/22/20   [provider]  propranolol (INDERAL) 10 MG tablet Take 10 mg by mouth 2 (two) times daily. 08/31/19   [provider]  QUEtiapine (SEROQUEL) 25 MG tablet Take 25 mg by mouth at bedtime. 12/28/20   [provider]  rivaroxaban (XARELTO) 20 MG TABS tablet Take 1 tablet (20 mg total) by mouth daily with supper. 04/07/21   Wellington Hampshire, MD  sertraline (ZOLOFT) 100 MG tablet Take 100 mg by mouth daily. 09/07/19   [provider]  vitamin B-12 (CYANOCOBALAMIN) 1000 MCG tablet Take 1,000 mcg by mouth daily. Patient not taking: Reported on 02/16/2021    [provider]    Allergies Penicillins  Family History  Problem Relation Age of Onset   Cancer Mother    Cancer Father     Social History Social History   Tobacco Use   Smoking status: Never   Smokeless tobacco: Former    Types: Chew    Quit date: 10/05/2009  Vaping Use   Vaping Use: Never used  Substance Use Topics   Alcohol use: Never   Drug use: Never    Review of Systems  Unable to be accurately assessed due to patient's disorientation and dementia. ____________________________________________   PHYSICAL EXAM:  VITAL SIGNS: Vitals:    06/13/2021 1304 06/28/2021 1553  BP: (!) 155/141 (!) 146/131  Pulse: (!) 107 97  Resp: 15 16  Temp:  98.1 F (36.7 C)  SpO2: 97% 97%    Constitutional: Alert and pleasantly disoriented. Well appearing and in no acute distress.  Ambulatory with a normal gait. Eyes: Conjunctivae are normal. PERRL. EOMI. Head: Abrasions to left-sided forehead.  No periorbital step-offs or tenderness.  No proptosis or evidence of EOM entrapment.  No bony step-offs or laceration Nose: No congestion/rhinnorhea. Mouth/Throat: Mucous membranes are moist.  Oropharynx non-erythematous. Neck: No stridor. No cervical spine tenderness to palpation. Cardiovascular: Normal rate, regular rhythm. Grossly normal heart sounds.  Good peripheral circulation. Respiratory: Normal respiratory effort.  No retractions. Lungs CTAB. Gastrointestinal: Soft , nondistended, nontender to palpation. No CVA tenderness. Musculoskeletal: No  lower extremity tenderness nor edema.  No joint effusions. No signs of acute trauma. Neurologic:  Normal speech and language. No gross focal neurologic deficits are appreciated. No gait instability noted. Cranial nerves II through XII intact 5/5 strength and sensation in all 4 extremities Skin:  Skin is warm, dry and intact. No rash noted. Psychiatric: Mood and affect are normal. Speech and behavior are normal. ____________________________________________   LABS (all labs ordered are listed, but only abnormal results are displayed)  Labs Reviewed  RESP PANEL BY RT-PCR (FLU A&B, COVID) ARPGX2  CBC WITH DIFFERENTIAL/PLATELET  BASIC METABOLIC PANEL   ____________________________________________  12 Lead EKG   ____________________________________________  RADIOLOGY  ED MD interpretation: CT head reviewed by me with trace right-sided frontal SDH,.  No shift  Official radiology report(s): CT HEAD WO CONTRAST (5MM)  Result Date: 07/04/2021 CLINICAL DATA:  Status post fall. History of dementia.  On blood thinners. EXAM: CT HEAD WITHOUT CONTRAST CT CERVICAL SPINE WITHOUT CONTRAST TECHNIQUE: Multidetector CT imaging of the head and cervical spine was performed following the standard protocol without intravenous contrast. Multiplanar CT image reconstructions of the cervical spine were also generated. COMPARISON:  None. FINDINGS: CT HEAD FINDINGS Brain: Generalized parenchymal volume loss with commensurate dilatation of the ventricles and sulci. Mild chronic small vessel ischemic changes within the bilateral periventricular and subcortical white matter regions. Trace thin extra-axial hemorrhage overlying the RIGHT frontotemporal lobe (series 3, images 12 through 14), measuring approximately 1-2 mm greatest thickness. No additional extra-axial hemorrhage. No parenchymal or intraventricular hemorrhage is identified. No mass effect, midline shift or herniation. Vascular: Chronic calcified atherosclerotic changes of the large vessels at the skull base. No unexpected hyperdense vessel. Skull: Normal. Negative for fracture or focal lesion. Sinuses/Orbits: No acute finding. Other: Scalp edema overlying the frontal bones. No circumscribed soft tissue hematoma. No underlying fracture. CT CERVICAL SPINE FINDINGS Alignment: No evidence of acute vertebral body subluxation. Skull base and vertebrae: No fracture line or displaced fracture fragment. Soft tissues and spinal canal: No prevertebral fluid or swelling. No visible canal hematoma. Disc levels: Chronic degenerative spondylosis throughout the cervical spine, mild to moderate in degree, with associated disc space narrowings and osseous spurring. Upper chest: Negative. Other: Bilateral carotid atherosclerosis. IMPRESSION: 1. Trace thin extra-axial hemorrhage overlying the RIGHT frontotemporal lobe, presumably acute, measuring approximately 1-2 mm greatest thickness. No associated mass effect. No additional intracranial hemorrhage. 2. Scalp edema overlying the frontal  bones. No underlying fracture. 3. No fracture or acute subluxation within the cervical spine. 4. Chronic degenerative spondylosis of the cervical spine, mild to moderate in degree. 5. Carotid atherosclerosis. Critical Value/emergent results were called by telephone at the time of interpretation on 06/23/2021 at 1:40 pm to provider PHILLIP STAFFORD , who verbally acknowledged these results. Electronically Signed   By: Franki Cabot M.D.   On: 06/13/2021 13:40   CT Cervical Spine Wo Contrast  Result Date: 07/02/2021 CLINICAL DATA:  Status post fall. History of dementia. On blood thinners. EXAM: CT HEAD WITHOUT CONTRAST CT CERVICAL SPINE WITHOUT CONTRAST TECHNIQUE: Multidetector CT imaging of the head and cervical spine was performed following the standard protocol without intravenous contrast. Multiplanar CT image reconstructions of the cervical spine were also generated. COMPARISON:  None. FINDINGS: CT HEAD FINDINGS Brain: Generalized parenchymal volume loss with commensurate dilatation of the ventricles and sulci. Mild chronic small vessel ischemic changes within the bilateral periventricular and subcortical white matter regions. Trace thin extra-axial hemorrhage overlying the RIGHT frontotemporal lobe (series 3, images 12 through 14), measuring  approximately 1-2 mm greatest thickness. No additional extra-axial hemorrhage. No parenchymal or intraventricular hemorrhage is identified. No mass effect, midline shift or herniation. Vascular: Chronic calcified atherosclerotic changes of the large vessels at the skull base. No unexpected hyperdense vessel. Skull: Normal. Negative for fracture or focal lesion. Sinuses/Orbits: No acute finding. Other: Scalp edema overlying the frontal bones. No circumscribed soft tissue hematoma. No underlying fracture. CT CERVICAL SPINE FINDINGS Alignment: No evidence of acute vertebral body subluxation. Skull base and vertebrae: No fracture line or displaced fracture fragment. Soft  tissues and spinal canal: No prevertebral fluid or swelling. No visible canal hematoma. Disc levels: Chronic degenerative spondylosis throughout the cervical spine, mild to moderate in degree, with associated disc space narrowings and osseous spurring. Upper chest: Negative. Other: Bilateral carotid atherosclerosis. IMPRESSION: 1. Trace thin extra-axial hemorrhage overlying the RIGHT frontotemporal lobe, presumably acute, measuring approximately 1-2 mm greatest thickness. No associated mass effect. No additional intracranial hemorrhage. 2. Scalp edema overlying the frontal bones. No underlying fracture. 3. No fracture or acute subluxation within the cervical spine. 4. Chronic degenerative spondylosis of the cervical spine, mild to moderate in degree. 5. Carotid atherosclerosis. Critical Value/emergent results were called by telephone at the time of interpretation on 06/18/2021 at 1:40 pm to provider PHILLIP STAFFORD , who verbally acknowledged these results. Electronically Signed   By: Franki Cabot M.D.   On: 06/14/2021 13:40    ____________________________________________   PROCEDURES and INTERVENTIONS  Procedure(s) performed (including Critical Care):  .Critical Care Performed by: Vladimir Crofts, MD Authorized by: Vladimir Crofts, MD   Critical care provider statement:    Critical care time (minutes):  30   Critical care time was exclusive of:  Separately billable procedures and treating other patients   Critical care was necessary to treat or prevent imminent or life-threatening deterioration of the following conditions:  CNS failure or compromise   Critical care was time spent personally by me on the following activities:  Development of treatment plan with patient or surrogate, discussions with consultants, evaluation of patient's response to treatment, examination of patient, ordering and review of laboratory studies, ordering and review of radiographic studies, ordering and performing treatments  and interventions, pulse oximetry, re-evaluation of patient's condition and review of old charts  Medications  haloperidol lactate (HALDOL) injection 5 mg (has no administration in time range)    ____________________________________________   MDM / ED COURSE   Pleasantly demented 75 year old male on Xarelto presents to the ED after mechanical fall with evidence of a small SDH requiring observation time and repeat study.  He is neurologically intact without deficits, he is GCS 15.  Has an abrasion to his left side of his forehead, but no laceration to require repair, evidence of EOM entrapment or other complications.  No indications for blood work or further diagnostics beyond imaging.  Wife is tearful and unable to care for the patient at home.  No other family members to help.  Really do not have a safe discharge plan.  We will discuss with medicine for admission to facilitate placement and observation for his subdural.  Clinical Course as of 07/02/2021 1830  Mon Jul 03, 2021  1438 I discuss with Dr. Lacinda Axon. Hold xarelto for 7-10day, re-image after obs. Follow up in clinic. Hold xarelto until follow up [DS]    Clinical Course User Index [DS] Vladimir Crofts, MD    ____________________________________________   FINAL CLINICAL IMPRESSION(S) / ED DIAGNOSES  Final diagnoses:  Fall, initial encounter  Injury of head, initial encounter  SDH (subdural hematoma)     ED Discharge Orders     None        Riannon Mukherjee Tamala Julian   Note:  This document was prepared using Dragon voice recognition software and may include unintentional dictation errors.    Vladimir Crofts, MD 06/21/2021 1519    Vladimir Crofts, MD 06/28/2021 [phone]

## 2021-07-03 NOTE — Significant Event (Signed)
Cross Coverage Note  Received nursing message via Secure Chat:   0827 pm: Good evening. Just got Mr. Buenger on the unit and he's very confused completely disoriented. Has tried to exit the bed twice and he's been here maybe 10 minutes. Can we get something prn for agitation?  Haldol prn placed  At 10:12 pm: 'hey he got his seroquel. had to give haldol immediately because he is up going in patient rooms and made a couple attempts to go down the stairs. Can I get an order for a sitter? He is refusing to wear tele or allow [place] to touch him. It took six of [place] to get him in a wheelchair. '   Sitter ordered  'Hi! Messaging you about patient bc nurse is currently with him. He is very restless and agitated and trying to walk out of unit. Can we get something to calm him down? Haldol didn't do very much' 'we currently don't have a sitter to watch him so staff is having to go in and out of room to hlep'  I advised RN to consider having patient be moved and sitting at nursing station. This has been done in the past for patients with advanced dementia and acute delirium  RN states that patient is physically strong and combative.   Ativan 2 mg IV q4h prn for anxiety, agitation placed with instructions: Please hold if patient has HR < 60 and/or SPB < 100 and if patient does not appear to be protecting their airway. Please let provider know if this is the case. Thank you.  Dr. Tobie Poet [facility]

## 2021-07-03 NOTE — TOC Initial Note (Signed)
Transition of Care [facility]) - Initial/Assessment Note    Patient Details  Name: Tanner Oconnell MRN: [id number] Date of Birth: 1945-09-30  [facility] CM/SW Contact:    Anselm Pancoast, RN Phone Number: 06/09/2021, 4:59 PM  Clinical Narrative:                 Call to [name] in admission @ [facility] updating patient is currently in emergency department and wife is reporting patient has bed pending at [facility].   RN CM will assist with transition to [facility] due to unsafe discharge home with patient wandering.         Patient Goals and CMS Choice        Expected Discharge Plan and Services                                                Prior Living Arrangements/Services                       Activities of Daily Living      Permission Sought/Granted                  Emotional Assessment              Admission diagnosis:  Fall, confusion, head injury, EMS Patient Active Problem List   Diagnosis Date Noted   Acute CVA (cerebrovascular accident) ([place]) 02/03/2021   Stroke ([place]) 02/02/2021   Dementia ([place])    Type II diabetes mellitus with renal manifestations ([place])    Hyperlipidemia    Hypothyroidism    Hypertension    Elevated troponin    New onset atrial fibrillation ([place])    Atrial fibrillation with RVR ([place])    Depression    CKD (chronic kidney disease), stage IIIa    Atrial fibrillation, new onset ([place])    PCP:  Tracie Harrier, MD Pharmacy:   [facility] [place], [place] [address] Phone: [phone] Fax: [phone]     Social Determinants of Health (SDOH) Interventions    Readmission Risk Interventions No flowsheet data found.

## 2021-07-04 DIAGNOSIS — G309 Alzheimer's disease, unspecified: Secondary | ICD-10-CM

## 2021-07-04 DIAGNOSIS — F02818 Dementia in other diseases classified elsewhere, unspecified severity, with other behavioral disturbance: Secondary | ICD-10-CM | POA: Diagnosis not present

## 2021-07-04 DIAGNOSIS — S0990XA Unspecified injury of head, initial encounter: Secondary | ICD-10-CM | POA: Diagnosis not present

## 2021-07-04 DIAGNOSIS — N1832 Chronic kidney disease, stage 3b: Secondary | ICD-10-CM | POA: Diagnosis not present

## 2021-07-04 DIAGNOSIS — R410 Disorientation, unspecified: Secondary | ICD-10-CM | POA: Diagnosis not present

## 2021-07-04 DIAGNOSIS — I6200 Nontraumatic subdural hemorrhage, unspecified: Secondary | ICD-10-CM | POA: Diagnosis not present

## 2021-07-04 LAB — BASIC METABOLIC PANEL
Anion gap: 8 (ref 5–15)
BUN: 22 mg/dL (ref 8–23)
CO2: 26 mmol/L (ref 22–32)
Calcium: 9.8 mg/dL (ref 8.9–10.3)
Chloride: 104 mmol/L (ref 98–111)
Creatinine, Ser: 1.36 mg/dL — ABNORMAL HIGH (ref 0.61–1.24)
GFR, Estimated: 54 mL/min — ABNORMAL LOW (ref >60)
Glucose, Bld: 169 mg/dL — ABNORMAL HIGH (ref 70–99)
Potassium: 3.5 mmol/L (ref 3.5–5.1)
Sodium: 138 mmol/L (ref 135–145)

## 2021-07-04 LAB — CBC
HCT: 42.1 % (ref 39.0–52.0)
Hemoglobin: 14 g/dL (ref 13.0–17.0)
MCH: 29.1 pg (ref 26.0–34.0)
MCHC: 33.3 g/dL (ref 30.0–36.0)
MCV: 87.5 fL (ref 80.0–100.0)
Platelets: 170 10*3/uL (ref 150–400)
RBC: 4.81 MIL/uL (ref 4.22–5.81)
RDW: 13.4 % (ref 11.5–15.5)
WBC: 7.4 10*3/uL (ref 4.0–10.5)
nRBC: 0.3 % — ABNORMAL HIGH (ref 0.0–0.2)

## 2021-07-04 LAB — HEPATIC FUNCTION PANEL
ALT: 15 U/L (ref 0–44)
AST: 28 U/L (ref 15–41)
Albumin: 4.6 g/dL (ref 3.5–5.0)
Alkaline Phosphatase: 78 U/L (ref 38–126)
Bilirubin, Direct: 0.4 mg/dL — ABNORMAL HIGH (ref 0.0–0.2)
Indirect Bilirubin: 1.9 mg/dL — ABNORMAL HIGH (ref 0.3–0.9)
Total Bilirubin: 2.3 mg/dL — ABNORMAL HIGH (ref 0.3–1.2)
Total Protein: 7.9 g/dL (ref 6.5–8.1)

## 2021-07-04 LAB — MRSA NEXT GEN BY PCR, NASAL: MRSA by PCR Next Gen: NOT DETECTED

## 2021-07-04 LAB — GLUCOSE, CAPILLARY: Glucose-Capillary: 191 mg/dL — ABNORMAL HIGH (ref 70–99)

## 2021-07-04 MED ORDER — AMIODARONE LOAD VIA INFUSION: 150.0000 mg | Freq: Once | INTRAVENOUS | Status: DC

## 2021-07-04 MED ORDER — SERTRALINE HCL 50 MG PO TABS
ORAL_TABLET | ORAL | Status: AC
  Filled 2021-07-04: qty 2

## 2021-07-04 MED ORDER — ZIPRASIDONE MESYLATE 20 MG IM SOLR
10.0000 mg | Freq: Once | INTRAMUSCULAR | Status: AC
Start: 2021-07-04 — End: 2021-07-04
  Administered 2021-07-04: 20:00:00 10 mg via INTRAMUSCULAR
  Filled 2021-07-04: qty 20

## 2021-07-04 MED ORDER — AMIODARONE HCL IN DEXTROSE 360-4.14 MG/200ML-% IV SOLN: 30.0000 mg/h | INTRAVENOUS | Status: DC

## 2021-07-04 MED ORDER — HYDROXYZINE HCL 25 MG PO TABS
25.0000 mg | ORAL_TABLET | Freq: Three times a day (TID) | ORAL | Status: DC | PRN
  Administered 2021-07-04: 15:00:00 25 mg via ORAL
  Filled 2021-07-04 (×3): qty 1

## 2021-07-04 MED ORDER — ADULT MULTIVITAMIN W/MINERALS CH
1.0000 | ORAL_TABLET | Freq: Every day | ORAL | Status: DC
  Administered 2021-07-06 – 2021-07-09 (×2): 1 via ORAL
  Filled 2021-07-04 (×3): qty 1

## 2021-07-04 MED ORDER — LORAZEPAM 2 MG/ML IJ SOLN
INTRAMUSCULAR | Status: AC
  Filled 2021-07-04: qty 1

## 2021-07-04 MED ORDER — MEMANTINE HCL 10 MG PO TABS
10.0000 mg | ORAL_TABLET | Freq: Two times a day (BID) | ORAL | Status: DC
  Administered 2021-07-04 – 2021-07-09 (×7): 10 mg via ORAL
  Filled 2021-07-04 (×14): qty 1

## 2021-07-04 MED ORDER — TRAZODONE HCL 50 MG PO TABS: 50.0000 mg | ORAL_TABLET | Freq: Once | ORAL | Status: AC

## 2021-07-04 MED ORDER — TRAZODONE HCL 50 MG PO TABS
ORAL_TABLET | ORAL | Status: AC
  Administered 2021-07-04: 50 mg via ORAL
  Filled 2021-07-04: qty 1

## 2021-07-04 MED ORDER — AMIODARONE HCL IN DEXTROSE 360-4.14 MG/200ML-% IV SOLN: 60.0000 mg/h | INTRAVENOUS | Status: DC

## 2021-07-04 MED ORDER — HALOPERIDOL LACTATE 5 MG/ML IJ SOLN
2.0000 mg | Freq: Three times a day (TID) | INTRAMUSCULAR | Status: DC | PRN
  Administered 2021-07-04: 2 mg via INTRAMUSCULAR

## 2021-07-04 MED ORDER — ZIPRASIDONE MESYLATE 20 MG IM SOLR: 10.0000 mg | Freq: Once | INTRAMUSCULAR | Status: DC

## 2021-07-04 MED ORDER — HALOPERIDOL 2 MG PO TABS
2.0000 mg | ORAL_TABLET | Freq: Three times a day (TID) | ORAL | Status: DC | PRN
  Administered 2021-07-05 – 2021-07-06 (×2): 2 mg via ORAL
  Filled 2021-07-04 (×5): qty 1

## 2021-07-04 MED ORDER — ENSURE ENLIVE PO LIQD
237.0000 mL | Freq: Three times a day (TID) | ORAL | Status: DC
Start: 2021-07-04 — End: 2021-07-10
  Administered 2021-07-04 – 2021-07-09 (×6): 237 mL via ORAL

## 2021-07-04 MED ORDER — CHLORHEXIDINE GLUCONATE CLOTH 2 % EX PADS
6.0000 | MEDICATED_PAD | Freq: Every day | CUTANEOUS | Status: DC
  Administered 2021-07-04 – 2021-07-09 (×5): 6 via TOPICAL

## 2021-07-04 MED ORDER — MULTI-VITAMIN PO TABS: 1.0000 | ORAL_TABLET | Freq: Every day | ORAL | Status: DC

## 2021-07-04 MED ORDER — HALOPERIDOL 2 MG PO TABS
2.0000 mg | ORAL_TABLET | Freq: Three times a day (TID) | ORAL | Status: DC | PRN
  Filled 2021-07-04: qty 1

## 2021-07-04 MED ORDER — SERTRALINE HCL 50 MG PO TABS
100.0000 mg | ORAL_TABLET | Freq: Every day | ORAL | Status: DC
  Administered 2021-07-04 – 2021-07-09 (×4): 100 mg via ORAL
  Filled 2021-07-04 (×4): qty 2

## 2021-07-04 NOTE — Progress Notes (Signed)
Pt arrived to unit, pt agitated and trying to climb out of bed. Pt is disoriented x4 and unable to follow commands. Bath given, pt has dried stool on hi. 4 staff members in room to bathe pt and get him hooked to monitor. Vitals signs stable. Pt kicking feet and punching at staff. [place]s applied to pt. Pt received Geodon prior to transfer. Pt still very agitated. MD made aware of continued combative behavior. Bilateral wrist restraints applied for safety of patient and staff. Pt continues to throw feet over the side of bed. Will closely monitor pt
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
## 2021-07-04 NOTE — Progress Notes (Signed)
Pt transferred to ICU per MD order. Report given to Ty, Therapist, sports.

## 2021-07-04 NOTE — TOC Progression Note (Addendum)
Transition of Care [facility]) - Progression Note    Patient Details  Name: SHREYANSH TIFFANY MRN: [id number] Date of Birth: 03/23/1946  Transition of Care [facility]) CM/SW [place], RN Phone Number: 07/04/2021, 10:48 AM  Clinical Narrative:   Patient's wife at bedside, sitter also at bedside.  Spouse is concerned about patient condition.  When RNCM entered the room, patient was mumbling and slightly restless, was in the bed and did not exhibit aggressive behaviors and did not attempt to get out of bed during my visit.  Wife was concerned and stated she wanted to speak to patient's MD about adding more sedating medication and assessing patient's right arm, which she stated patient has pain.  Spouse states patient is very strong when he is resistant to staying in bed.  MD and care team made aware of all of the above.   [facility] will assess patient today for potential admission to their facility.  Miranda at facility states she will arrive by 1115 to see patient.  Miranda requested faxed documents to review prior to admission, sent to facility.  TOC contact information provided to spouse, awaiting [facility] assessment, TOC to follow to discharge.  Addendum:  [facility] is able to take patient as per [facility].  They will  require spouse to complete paperwork prior to accepting patient.  Spouse states she will complete paperwork this evening and deliver to [facility] tomorrow.  tOC to follow       Expected Discharge Plan and Services                                                 Social Determinants of Health (SDOH) Interventions    Readmission Risk Interventions No flowsheet data found.
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
## 2021-07-04 NOTE — Progress Notes (Signed)
Patient with increased agitation and unable to redirect. Swinging arms and pushing on staff to get by. Security called to bedside to help for safety of patient and staff. MD paged multiple times and received order to give IM haldol as IV was lost earlier in the shift.   IM haldol given and patient is now in bed with lights off and sitter at the bedside

## 2021-07-04 NOTE — NC FL2 (Signed)
[place] LEVEL OF CARE SCREENING TOOL     IDENTIFICATION  Patient Name: Tanner Oconnell Birthdate: 04/01/1946 Sex: male Admission Date (Current Location): 06/24/2021  [facility] and [place] Number:  Engineering geologist and Address:  [facility], [address], [place], [place] [address]      Provider Number: [id number]  Attending Physician Name and Address:  Fritzi Mandes, MD  Relative Name and Phone Number:  Phoenix Dresser [phone]    Current Level of Care: Hospital Recommended Level of Care: Memory Care Prior Approval Number:    Date Approved/Denied:   PASRR Number:    Discharge Plan: Other (Comment) (Memory Care-[facility])    Current Diagnoses: Patient Active Problem List   Diagnosis Date Noted   Sleep apnea 07/02/2021   Subdural hemorrhage ([place]) 06/18/2021   Acute CVA (cerebrovascular accident) ([place]) 02/03/2021   Stroke ([place]) 02/02/2021   Dementia ([place])    Type II diabetes mellitus with renal manifestations ([place])    Hyperlipidemia    Hypothyroidism    Hypertension    Elevated troponin    New onset atrial fibrillation ([place])    Atrial fibrillation with RVR ([place])    Depression    CKD (chronic kidney disease), stage IIIa    Atrial fibrillation, new onset ([place])     Orientation RESPIRATION BLADDER Height & Weight     Self  Normal Continent Weight: 105.2 kg Height:  6' (182.9 cm)  BEHAVIORAL SYMPTOMS/MOOD NEUROLOGICAL BOWEL NUTRITION STATUS  [place]   Continent Diet  AMBULATORY STATUS COMMUNICATION OF NEEDS Skin   Independent Verbally Normal                       Personal Care Assistance Level of Assistance  Bathing, Feeding, Dressing Bathing Assistance: Limited assistance Feeding assistance: Limited assistance Dressing Assistance: Limited assistance     Functional Limitations Info             SPECIAL CARE FACTORS FREQUENCY                       Contractures      Additional Factors Info                   Current Medications (07/04/2021):  This is the current hospital active medication list Current Facility-Administered Medications  Medication Dose Route Frequency Provider Last Rate Last Admin   0.9 %  sodium chloride infusion  250 mL Intravenous PRN Wouk, Ailene Rud, MD       amLODipine (NORVASC) tablet 5 mg  5 mg Oral Daily Gwynne Edinger, MD   5 mg at 06/08/2021 1957   atorvastatin (LIPITOR) tablet 40 mg  40 mg Oral Daily Wouk, Ailene Rud, MD       haloperidol lactate (HALDOL) injection 2 mg  2 mg Intravenous Q6H PRN Cox, Amy N, DO   2 mg at 06/24/2021 2205   levothyroxine (SYNTHROID) tablet 25 mcg  25 mcg Oral Daily Wouk, Ailene Rud, MD       losartan (COZAAR) tablet 100 mg  100 mg Oral Daily Wouk, Ailene Rud, MD       metFORMIN (GLUCOPHAGE) tablet 1,000 mg  1,000 mg Oral BID WC Wouk, Ailene Rud, MD       pantoprazole (PROTONIX) EC tablet 40 mg  40 mg Oral Daily Gwynne Edinger, MD   40 mg at 07/01/2021 1957   propranolol (INDERAL) tablet 10 mg  10 mg Oral  BID Gwynne Edinger, MD   10 mg at 06/08/2021 2145   QUEtiapine (SEROQUEL) tablet 25 mg  25 mg Oral QHS Gwynne Edinger, MD   25 mg at 06/21/2021 2145   sodium chloride flush (NS) 0.9 % injection 3 mL  3 mL Intravenous Q12H Gwynne Edinger, MD   3 mL at 07/01/2021 2145   sodium chloride flush (NS) 0.9 % injection 3 mL  3 mL Intravenous PRN Wouk, Ailene Rud, MD         Discharge Medications: Please see discharge summary for a list of discharge medications.  Relevant Imaging Results:  Relevant Lab Results:   Additional Information [id number]  Anselm Pancoast, RN
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
## 2021-07-04 NOTE — TOC Progression Note (Signed)
Transition of Care [facility]) - Progression Note    Patient Details  Name: Tanner Oconnell MRN: [id number] Date of Birth: 07/18/1946  Transition of Care [facility]) CM/SW [place], RN Phone Number: 07/04/2021, 8:43 AM  Clinical Narrative:    Received call from [facility] reporting they are able to accept patient today. Patient will need TB test which facility can read. No COVID needed. [facility] will send assessor to review patient today in preparation for discharge.         Expected Discharge Plan and Services                                                 Social Determinants of Health (SDOH) Interventions    Readmission Risk Interventions No flowsheet data found.
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
## 2021-07-05 DIAGNOSIS — R410 Disorientation, unspecified: Secondary | ICD-10-CM

## 2021-07-05 DIAGNOSIS — G309 Alzheimer's disease, unspecified: Secondary | ICD-10-CM | POA: Diagnosis not present

## 2021-07-05 DIAGNOSIS — N1832 Chronic kidney disease, stage 3b: Secondary | ICD-10-CM | POA: Diagnosis not present

## 2021-07-05 DIAGNOSIS — F02818 Dementia in other diseases classified elsewhere, unspecified severity, with other behavioral disturbance: Secondary | ICD-10-CM | POA: Diagnosis not present

## 2021-07-05 DIAGNOSIS — S0990XA Unspecified injury of head, initial encounter: Secondary | ICD-10-CM | POA: Diagnosis not present

## 2021-07-05 DIAGNOSIS — I6200 Nontraumatic subdural hemorrhage, unspecified: Secondary | ICD-10-CM | POA: Diagnosis not present

## 2021-07-05 LAB — GLUCOSE, RANDOM: Glucose, Bld: 236 mg/dL — ABNORMAL HIGH (ref 70–99)

## 2021-07-05 LAB — GLUCOSE, CAPILLARY
Glucose-Capillary: 165 mg/dL — ABNORMAL HIGH (ref 70–99)
Glucose-Capillary: 166 mg/dL — ABNORMAL HIGH (ref 70–99)

## 2021-07-05 MED ORDER — INSULIN ASPART 100 UNIT/ML IJ SOLN
0.0000 [IU] | Freq: Three times a day (TID) | INTRAMUSCULAR | Status: DC
  Administered 2021-07-05 – 2021-07-07 (×5): 2 [IU] via SUBCUTANEOUS
  Administered 2021-07-08 (×2): 1 [IU] via SUBCUTANEOUS
  Administered 2021-07-09 (×2): 2 [IU] via SUBCUTANEOUS
  Administered 2021-07-09: 17:00:00 1 [IU] via SUBCUTANEOUS
  Filled 2021-07-05 (×10): qty 1

## 2021-07-05 MED ORDER — INSULIN ASPART 100 UNIT/ML IJ SOLN: 0.0000 [IU] | Freq: Every day | INTRAMUSCULAR | Status: DC

## 2021-07-05 MED ORDER — RISPERIDONE 1 MG PO TBDP
0.5000 mg | ORAL_TABLET | Freq: Two times a day (BID) | ORAL | Status: DC
  Administered 2021-07-05 (×2): 0.5 mg via ORAL
  Filled 2021-07-05 (×3): qty 0.5

## 2021-07-05 MED ORDER — CLONIDINE HCL 0.1 MG/24HR TD PTWK
0.1000 mg | MEDICATED_PATCH | TRANSDERMAL | Status: DC
  Administered 2021-07-05: 0.1 mg via TRANSDERMAL
  Filled 2021-07-05: qty 1
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
## 2021-07-05 NOTE — Progress Notes (Signed)
PRN haldol given after multiple attempts to reposition, toilet, and redirect patient not to get out of bed.  Patient very aggressive with staff.  Attempted to sit on side of bed to void or to stand and void.  Bladder scan revealed less than 152ml in bladder. Patient continues to attempt to leave room and is not easily gotten back to bed.

## 2021-07-05 NOTE — Progress Notes (Addendum)
Unable to get accurate blood pressure on patient due to persistent agitation.
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
## 2021-07-05 NOTE — Progress Notes (Addendum)
Nutrition Follow-up  DOCUMENTATION CODES:   Obesity unspecified  INTERVENTION:   Ensure Enlive po TID, each supplement provides 350 kcal and 20 grams of protein  Magic cup TID with meals, each supplement provides 290 kcal and 9 grams of protein  MVI po daily   Pt at high refeed risk; recommend monitor potassium, magnesium and phosphorus labs daily until stable  NUTRITION DIAGNOSIS:   Moderate Malnutrition related to social / environmental circumstances (dementia) as evidenced by moderate fat depletion, moderate muscle depletion. -new diagnosis   GOAL:   Patient will meet greater than or equal to 90% of their needs  MONITOR:   PO intake, Supplement acceptance, Labs, Weight trends, Skin, I & O's  ASSESSMENT:   75 y.o. male with medical history significant for advanced alzheimer's dementia, CVA, Afib, DM, HTN, hypothyroidism, CKD III and esophageal varices who presents with fall, confusion and agitation and was found have SDH.  Met with pt and family in room today. Pt unable provide any nutrition related history r/t confusion. Family at bedside reports that pt has been having a slow decline in his oral intake over the past several months. Family also reports weight loss of ~20lbs over the past several months. Per chart, pt is down 18lbs(7%) over the past 6 months and is down 15lbs(6%) over the past 2 months; this is not significant. RN reports that pt has not eaten anything today. Offered pt a Ensure milkshake which he agrees to drink; pt reports that he prefers strawberry. Recommend continue supplements and MVI. Pt unlikely to tolerate nasogastric tube at this point r/t agitation. Pt is likely at refeed risk.   Medications reviewed and include: synthroid, metformin, MVI, protonix  Labs reviewed: K 3.5 wnl, creat 1.36(H), tbili 2.3(H)  NUTRITION - FOCUSED PHYSICAL EXAM:  Flowsheet Row Most Recent Value  Orbital Region No depletion  Upper Arm Region Moderate depletion   Thoracic and Lumbar Region Mild depletion  Buccal Region No depletion  Temple Region No depletion  Clavicle Bone Region Moderate depletion  Clavicle and Acromion Bone Region Moderate depletion  Scapular Bone Region Moderate depletion  Dorsal Hand Mild depletion  Patellar Region Moderate depletion  Anterior Thigh Region Moderate depletion  Posterior Calf Region Severe depletion  Edema (RD Assessment) None  Hair Reviewed  Eyes Reviewed  Mouth Reviewed  Skin Reviewed  Nails Reviewed   Diet Order:   Diet Order             Diet regular Room service appropriate? Yes; Fluid consistency: Thin  Diet effective now                  EDUCATION NEEDS:   No education needs have been identified at this time  Skin:  Skin Assessment: Reviewed RN Assessment  Last BM:  pta  Height:   Ht Readings from Last 1 Encounters:  07/04/21 6' (1.829 m)    Weight:   Wt Readings from Last 1 Encounters:  07/01/2021 105.2 kg   BMI:  Body mass index is 31.46 kg/m.  Estimated Nutritional Needs:   Kcal:  2200-2500kcal/day  Protein:  110-125g/day  Fluid:  2.0-2.3L/day  Koleen Distance MS, RD, LDN Please refer to [facility] for RD and/or RD on-call/weekend/after hours pager

## 2021-07-05 NOTE — Progress Notes (Signed)
Dr. Posey Pronto messaged about patients confusion, agitation, and aggressive behavior with staff.  Patient will not follow simple commands at this time. Nurse unable to reorient patient.  This nurse concerned about giving more sedating medications.  Order obtained for psychiatry to be consulted to help with medical management of antipsychotics.

## 2021-07-05 NOTE — Progress Notes (Signed)
Patient hypertensive.  Dr. Posey Pronto notified.  Order to be placed for clonidine patch.

## 2021-07-05 NOTE — Progress Notes (Signed)
Patient ID: Tanner Oconnell, male   DOB: 18-Nov-1945, 75 y.o.   MRN: [id number] D/w RN--pt was off restraints for a bit to allow use the toilet. Currently on loose restraints for safety

## 2021-07-05 NOTE — Progress Notes (Signed)
Yellow metal ring with green stone removed from right pinky finger.  Yellow metal ring removed from right ring finger.  Yellow metal ring with red stone and small clear stone in center of red stone removed from left pinky finger. All rings given to wife Tanner Oconnell.

## 2021-07-05 NOTE — Progress Notes (Signed)
11:12 First dose of risperdal disintegrating tablet given per Dr. Weber Cooks order.  12:30 Patient appears more calm and was offered urinal.  Patient voided 665ml of urine in urinal.

## 2021-07-05 NOTE — Consult Note (Signed)
[name]   Reason for Consult: Consult for 75 year old man with a history of dementia currently in the ICU with continued agitation Referring Physician: Posey Pronto Patient Identification: Tanner Oconnell MRN:  [id number] Principal Diagnosis: Acute delirium Diagnosis:  Principal Problem:   Acute delirium Active Problems:   Stroke ([place])   Dementia ([place])   Type II diabetes mellitus with renal manifestations ([place])   Hypothyroidism   Hypertension   New onset atrial fibrillation ([place])   CKD (chronic kidney disease), stage IIIa   Sleep apnea   Subdural hemorrhage ([place])   Head injury   Total Time spent with patient: 1 hour  Subjective:   Tanner Oconnell is a 75 y.o. male patient admitted with patient not able to offer any information.  History primarily obtained from nurses on duty from family and from the chart.  This is a 75 year old man with a known history of dementia who presented to the hospital after a fall at home.  Patient had been getting increasingly agitated and difficult for the wife to control.  He was having frequent spells of running away and of getting aggressive at times at home where he had become unsafe and unmanageable.  During the most recent of these episodes he tripped and struck his head and has a subdural hematoma.  Patient has continued to show agitated behavior when sedation is withdrawn with episodes of striking out at staff with hands and head butting.  Patient unable to give any information himself.Marland Kitchen  HPI: See note above.  Patient is currently confused.  Asleep when I first came in and when he did wake up he was clearly disoriented.  The things he was saying made no sense.  Seemed to be responding to inappropriate stimuli.  He was not expressing angry hostility to nursing but was unable to cooperate and would react sometimes in a potentially dangerous manner by jerking his hands away or lurching around in bed.  Patient had been diagnosed with  dementia most likely related Alzheimer's disease and had seen Dr. Manuella Ghazi for outpatient evaluation and was on some oral medication but recently had become noncompliant at home.  No evidence of any intentional self harm.  No evidence of any substance abuse.  Past Psychiatric History: Prior to this development of dementia over the last couple years no report of prior psychiatric history.  Has been prescribed sertraline for depression which seems to largely be related to the dementia but no history of severe major depression or hospitalization or suicide attempts or substance abuse history identified  Risk to Self:   Risk to Others:   Prior Inpatient Therapy:   Prior Outpatient Therapy:    Past Medical History:  Past Medical History:  Diagnosis Date   Dementia ([place])    Diabetes mellitus without complication ([place])    History of kidney stones    Hyperlipidemia    Hypertension    Hypothyroidism    Obstructive sleep apnea    refuses to wear cpap   Thrombocytopenia ([place])    Thyroid disease    Hypothyroidism    Past Surgical History:  Procedure Laterality Date   BONE MARROW BIOPSY     COLONOSCOPY     COLONOSCOPY WITH PROPOFOL N/A 02/24/2020   Procedure: COLONOSCOPY WITH PROPOFOL;  Surgeon: Robert Bellow, MD;  Location: [facility] ENDOSCOPY;  Service: Gastroenterology;  Laterality: N/A;   ESOPHAGOGASTRODUODENOSCOPY N/A 02/24/2020   Procedure: ESOPHAGOGASTRODUODENOSCOPY (EGD);  Surgeon: Robert Bellow, MD;  Location: [facility] ENDOSCOPY;  Service:  Gastroenterology;  Laterality: N/A;   Family History:  Family History  Problem Relation Age of Onset   Cancer Mother    Cancer Father    Family Psychiatric  History: None reported Social History:  Social History   Substance and Sexual Activity  Alcohol Use Never     Social History   Substance and Sexual Activity  Drug Use Never    Social History   Socioeconomic History   Marital status: Married    Spouse name: Not on file   Number of  children: Not on file   Years of education: Not on file   Highest education level: Not on file  Occupational History   Not on file  Tobacco Use   Smoking status: Never   Smokeless tobacco: Former    Types: Chew    Quit date: 10/05/2009  Vaping Use   Vaping Use: Never used  Substance and Sexual Activity   Alcohol use: Never   Drug use: Never   Sexual activity: Not on file  Other Topics Concern   Not on file  Social History Narrative   Not on file   Social Determinants of Health   Financial Resource Strain: Not on file  Food Insecurity: Not on file  Transportation Needs: Not on file  Physical Activity: Not on file  Stress: Not on file  Social Connections: Not on file   Additional Social History:    Allergies:   Allergies  Allergen Reactions   Penicillins Rash    Unknown reaction Red bumps.    Labs:  Results for orders placed or performed during the hospital encounter of 06/23/2021 (from the past 48 hour(s))  CBC with Differential/Platelet     Status: Abnormal   Collection Time: 06/08/2021  7:44 PM  Result Value Ref Range   WBC 5.3 4.0 - 10.5 K/uL   RBC 4.31 4.22 - 5.81 MIL/uL   Hemoglobin 12.6 (L) 13.0 - 17.0 g/dL   HCT 37.5 (L) 39.0 - 52.0 %   MCV 87.0 80.0 - 100.0 fL   MCH 29.2 26.0 - 34.0 pg   MCHC 33.6 30.0 - 36.0 g/dL   RDW 13.5 11.5 - 15.5 %   Platelets 146 (L) 150 - 400 K/uL   nRBC 0.0 0.0 - 0.2 %   Neutrophils Relative % 68 %   Neutro Abs 3.7 1.7 - 7.7 K/uL   Lymphocytes Relative 20 %   Lymphs Abs 1.1 0.7 - 4.0 K/uL   Monocytes Relative 9 %   Monocytes Absolute 0.5 0.1 - 1.0 K/uL   Eosinophils Relative 2 %   Eosinophils Absolute 0.1 0.0 - 0.5 K/uL   Basophils Relative 1 %   Basophils Absolute 0.0 0.0 - 0.1 K/uL   Immature Granulocytes 0 %   Abs Immature Granulocytes 0.01 0.00 - 0.07 K/uL    Comment: Performed at [facility], [address] [place], [place] [address]  Basic metabolic panel     Status: Abnormal   Collection Time:  06/06/2021  7:44 PM  Result Value Ref Range   Sodium 139 135 - 145 mmol/L   Potassium 3.6 3.5 - 5.1 mmol/L   Chloride 106 98 - 111 mmol/L   CO2 26 22 - 32 mmol/L   Glucose, Bld 126 (H) 70 - 99 mg/dL    Comment: Glucose reference range applies only to samples taken after fasting for at least 8 hours.   BUN 24 (H) 8 - 23 mg/dL   Creatinine, Ser 1.55 (H) 0.61 -  1.24 mg/dL   Calcium 9.4 8.9 - 10.3 mg/dL   GFR, Estimated 46 (L) >60 mL/min    Comment: (NOTE) Calculated using the CKD-EPI Creatinine Equation (2021)    Anion gap 7 5 - 15    Comment: Performed at [facility], [address] [place], [place] [address]  Resp Panel by RT-PCR (Flu A&B, Covid) Nasopharyngeal Swab     Status: None   Collection Time: 06/13/2021  7:44 PM   Specimen: Nasopharyngeal Swab; Nasopharyngeal(NP) swabs in vial transport medium  Result Value Ref Range   SARS Coronavirus 2 by RT PCR NEGATIVE NEGATIVE    Comment: (NOTE) SARS-CoV-2 target nucleic acids are NOT DETECTED.  The SARS-CoV-2 RNA is generally detectable in upper respiratory specimens during the acute phase of infection. The lowest concentration of SARS-CoV-2 viral copies this assay can detect is 138 copies/mL. A negative result does not preclude SARS-Cov-2 infection and should not be used as the sole basis for treatment or other patient management decisions. A negative result may occur with  improper specimen collection/handling, submission of specimen other than nasopharyngeal swab, presence of viral mutation(s) within the areas targeted by this assay, and inadequate number of viral copies(<138 copies/mL). A negative result must be combined with clinical observations, patient history, and epidemiological information. The expected result is Negative.  Fact Sheet for Patients:  EntrepreneurPulse.com.au  Fact Sheet for Healthcare Providers:  IncredibleEmployment.be  This test is no t yet approved or  cleared by the [place] FDA and  has been authorized for detection and/or diagnosis of SARS-CoV-2 by FDA under an Emergency Use Authorization (EUA). This EUA will remain  in effect (meaning this test can be used) for the duration of the COVID-19 declaration under Section 564(b)(1) of the Act, 21 U.S.C.section 360bbb-3(b)(1), unless the authorization is terminated  or revoked sooner.       Influenza A by PCR NEGATIVE NEGATIVE   Influenza B by PCR NEGATIVE NEGATIVE    Comment: (NOTE) The Xpert Xpress SARS-CoV-2/FLU/RSV plus assay is intended as an aid in the diagnosis of influenza from Nasopharyngeal swab specimens and should not be used as a sole basis for treatment. Nasal washings and aspirates are unacceptable for Xpert Xpress SARS-CoV-2/FLU/RSV testing.  Fact Sheet for Patients: EntrepreneurPulse.com.au  Fact Sheet for Healthcare Providers: IncredibleEmployment.be  This test is not yet approved or cleared by the [place] FDA and has been authorized for detection and/or diagnosis of SARS-CoV-2 by FDA under an Emergency Use Authorization (EUA). This EUA will remain in effect (meaning this test can be used) for the duration of the COVID-19 declaration under Section 564(b)(1) of the Act, 21 U.S.C. section 360bbb-3(b)(1), unless the authorization is terminated or revoked.  Performed at [facility], [address] [place], [place] [address]   Basic metabolic panel     Status: Abnormal   Collection Time: 07/04/21  5:57 AM  Result Value Ref Range   Sodium 138 135 - 145 mmol/L   Potassium 3.5 3.5 - 5.1 mmol/L   Chloride 104 98 - 111 mmol/L   CO2 26 22 - 32 mmol/L   Glucose, Bld 169 (H) 70 - 99 mg/dL    Comment: Glucose reference range applies only to samples taken after fasting for at least 8 hours.   BUN 22 8 - 23 mg/dL   Creatinine, Ser 1.36 (H) 0.61 - 1.24 mg/dL   Calcium 9.8 8.9 - 10.3 mg/dL   GFR, Estimated 54 (L)  >60 mL/min    Comment: (NOTE)  Calculated using the CKD-EPI Creatinine Equation (2021)    Anion gap 8 5 - 15    Comment: Performed at [facility], [address] [place], [place] [address]  CBC     Status: Abnormal   Collection Time: 07/04/21  5:57 AM  Result Value Ref Range   WBC 7.4 4.0 - 10.5 K/uL   RBC 4.81 4.22 - 5.81 MIL/uL   Hemoglobin 14.0 13.0 - 17.0 g/dL   HCT 42.1 39.0 - 52.0 %   MCV 87.5 80.0 - 100.0 fL   MCH 29.1 26.0 - 34.0 pg   MCHC 33.3 30.0 - 36.0 g/dL   RDW 13.4 11.5 - 15.5 %   Platelets 170 150 - 400 K/uL   nRBC 0.3 (H) 0.0 - 0.2 %    Comment: Performed at  [facility], [address] [place], [place] [address]  Hepatic function panel     Status: Abnormal   Collection Time: 07/04/21  5:57 AM  Result Value Ref Range   Total Protein 7.9 6.5 - 8.1 g/dL   Albumin 4.6 3.5 - 5.0 g/dL   AST 28 15 - 41 U/L   ALT 15 0 - 44 U/L   Alkaline Phosphatase 78 38 - 126 U/L   Total Bilirubin 2.3 (H) 0.3 - 1.2 mg/dL   Bilirubin, Direct 0.4 (H) 0.0 - 0.2 mg/dL   Indirect Bilirubin 1.9 (H) 0.3 - 0.9 mg/dL    Comment: Performed at [facility], [address] [place], [place] [address]  Glucose, capillary     Status: Abnormal   Collection Time: 07/04/21  8:31 PM  Result Value Ref Range   Glucose-Capillary 191 (H) 70 - 99 mg/dL    Comment: Glucose reference range applies only to samples taken after fasting for at least 8 hours.  MRSA Next Gen by PCR, Nasal     Status: None   Collection Time: 07/04/21  8:46 PM   Specimen: Nasal Mucosa; Nasal Swab  Result Value Ref Range   MRSA by PCR Next Gen NOT DETECTED NOT DETECTED    Comment: (NOTE) The GeneXpert MRSA Assay (FDA approved for NASAL specimens only), is one component of a comprehensive MRSA colonization surveillance program. It is not intended to diagnose MRSA infection nor to guide or monitor treatment for MRSA infections. Test performance is not FDA approved in patients less than 77  years old. Performed at [facility], [address] [place], [place] [address]   Glucose, random     Status: Abnormal   Collection Time: 07/05/21  4:10 AM  Result Value Ref Range   Glucose, Bld 236 (H) 70 - 99 mg/dL    Comment: Glucose reference range applies only to samples taken after fasting for at least 8 hours. Performed at [facility], [address] [place], [place] [address]     Current Facility-Administered Medications  Medication Dose Route Frequency Provider Last Rate Last Admin   0.9 %  sodium chloride infusion  250 mL Intravenous PRN Wouk, Ailene Rud, MD       amLODipine (NORVASC) tablet 5 mg  5 mg Oral Daily Gwynne Edinger, MD   5 mg at 07/05/21 1042   atorvastatin (LIPITOR) tablet 40 mg  40 mg Oral Daily Gwynne Edinger, MD   40 mg at 07/04/21 0944   Chlorhexidine Gluconate Cloth 2 % PADS 6 each  6 each Topical Q0600 Sharion Settler, NP   6 each at 07/05/21 0602   feeding supplement (  ENSURE ENLIVE / ENSURE PLUS) liquid 237 mL  237 mL Oral TID BM Fritzi Mandes, MD   237 mL at 07/04/21 1451   haloperidol (HALDOL) tablet 2 mg  2 mg Oral Q8H PRN Fritzi Mandes, MD       haloperidol lactate (HALDOL) injection 2 mg  2 mg Intravenous Q6H PRN Cox, Amy N, DO   2 mg at 06/08/2021 2205   hydrOXYzine (ATARAX) tablet 25 mg  25 mg Oral TID PRN Fritzi Mandes, MD   25 mg at 07/04/21 1451   levothyroxine (SYNTHROID) tablet 25 mcg  25 mcg Oral Daily Gwynne Edinger, MD   25 mcg at 07/05/21 0539   losartan (COZAAR) tablet 100 mg  100 mg Oral Daily Gwynne Edinger, MD   100 mg at 07/05/21 1042   memantine (NAMENDA) tablet 10 mg  10 mg Oral BID Fritzi Mandes, MD   10 mg at 07/05/21 1042   metFORMIN (GLUCOPHAGE) tablet 1,000 mg  1,000 mg Oral BID WC Gwynne Edinger, MD   1,000 mg at 07/04/21 1635   multivitamin with minerals tablet 1 tablet  1 tablet Oral Daily Fritzi Mandes, MD       pantoprazole (PROTONIX) EC tablet 40 mg  40 mg Oral Daily Gwynne Edinger, MD    40 mg at 07/04/21 0944   propranolol (INDERAL) tablet 10 mg  10 mg Oral BID Gwynne Edinger, MD   10 mg at 07/05/21 1041   QUEtiapine (SEROQUEL) tablet 25 mg  25 mg Oral QHS Gwynne Edinger, MD   25 mg at 07/04/21 2255   risperiDONE (RISPERDAL M-TABS) disintegrating tablet 0.5 mg  0.5 mg Oral BID Crystallynn Noorani, Madie Reno, MD       sertraline (ZOLOFT) tablet 100 mg  100 mg Oral Daily Fritzi Mandes, MD   100 mg at 07/05/21 1042   sodium chloride flush (NS) 0.9 % injection 3 mL  3 mL Intravenous Q12H Wouk, Ailene Rud, MD   3 mL at 07/04/21 0945   sodium chloride flush (NS) 0.9 % injection 3 mL  3 mL Intravenous PRN Wouk, Ailene Rud, MD        Musculoskeletal: Strength & Muscle Tone: within normal limits Gait & Station: unable to stand Patient leans: N/A            Psychiatric Specialty Exam:  Presentation  General Appearance: No data recorded Eye Contact:No data recorded Speech:No data recorded Speech Volume:No data recorded Handedness:No data recorded  Mood and Affect  Mood:No data recorded Affect:No data recorded  Thought Process  Thought Processes:No data recorded Descriptions of Associations:No data recorded Orientation:No data recorded Thought Content:No data recorded History of Schizophrenia/Schizoaffective disorder:No data recorded Duration of Psychotic Symptoms:No data recorded Hallucinations:No data recorded Ideas of Reference:No data recorded Suicidal Thoughts:No data recorded Homicidal Thoughts:No data recorded  Sensorium  Memory:No data recorded Judgment:No data recorded Insight:No data recorded  Executive Functions  Concentration:No data recorded Attention Span:No data recorded Recall:No data recorded Fund of Knowledge:No data recorded Language:No data recorded  Psychomotor Activity  Psychomotor Activity:No data recorded  Assets  Assets:No data recorded  Sleep  Sleep:No data recorded  Physical Exam: Physical Exam Vitals and nursing  note reviewed.  Constitutional:      Appearance: He is ill-appearing.  HENT:     Head: Normocephalic and atraumatic.     Mouth/Throat:     Pharynx: Oropharynx is clear.  Eyes:     Pupils: Pupils are equal, round, and reactive to light.  Cardiovascular:     Rate and Rhythm: Normal rate and regular rhythm.  Pulmonary:     Effort: Pulmonary effort is normal.     Breath sounds: Normal breath sounds.  Abdominal:     General: Abdomen is flat.     Palpations: Abdomen is soft.  Musculoskeletal:        General: Normal range of motion.  Skin:    General: Skin is warm and dry.       Neurological:     General: No focal deficit present.     Mental Status: Mental status is at baseline.  Psychiatric:        Attention and Perception: He is inattentive.        Speech: He is noncommunicative.        Behavior: Behavior is agitated.        Cognition and Memory: Cognition is impaired. Memory is impaired.        Judgment: Judgment is impulsive and inappropriate.   Review of Systems  Unable to perform ROS: Dementia  Blood pressure (!) 178/92, pulse (!) 103, temperature 99 F (37.2 C), temperature source Axillary, resp. rate (!) 23, height 6' (1.829 m), weight 105.2 kg, SpO2 100 %. Body mass index is 31.46 kg/m.  Treatment Plan Summary: Medication management and Plan [place] difficult situation as we are seeing more frequently in the hospital.  Patient currently is delirious and probably functioning even worse than his baseline which may in part be because of the fall and the subdural hematoma and the hospitalization.  Family appropriately has a goal of trying to get him into a memory care unit but it is recognized by all that in his current condition he would not be manageable or even placeable at such a unit.  Therefore our goal is to try and control his behavior and current functioning to where he could be acceptable at a memory care unit.  Patient is noncompliant currently with oral medicine.   We want to get it to where he does not need IV or IM medications so he can be managed outpatient.  I suggest starting Risperdal oral dissolving tablets half milligram twice a day.  Explained to family this may take a few days at least of adjusting medication to see some improvement.  No other change to orders right now.  Will follow up.  Disposition: Supportive therapy provided about ongoing stressors. Discussed crisis plan, support from social network, calling 911, coming to the Emergency Department, and calling Suicide Hotline.  Alethia Berthold, MD 07/05/2021 10:45 AM

## 2021-07-05 NOTE — Progress Notes (Signed)
Patient with noted increase in agitation and restlessness.  Nurse, sitter and nurse tech attempted to walk patient to commode but patient was persistent about walking out of room.  Unable to reorient or redirect patient.  Urinal offered multiple times but patient pushed staff away. Patient pulling and becoming aggressive with staff.  Bed brought to patient and positioned to allow him to sit down.  Patient assisted back to bed and bilateral wrist restraints applied.

## 2021-07-05 NOTE — Progress Notes (Signed)
Patient following simple commands and appears calm.  Restraints released and patient walked to commode with nurse and help from [facility], Chartered certified accountant.  Patient voided in toilet and then assisted back to bed.  Dr. Posey Pronto notified of progress.

## 2021-07-05 NOTE — TOC Progression Note (Signed)
Transition of Care [name]) - Progression Note    Patient Details  Name: Tanner Oconnell MRN: [id number] Date of Birth: 23-Jun-1946  Transition of Care [facility]) CM/SW [place], [place] Phone Number: [phone] 07/05/2021, 3:59 PM  Clinical Narrative:     CSW spoke with [name] at [facility], she stated the patient still has bed offer but they will not be able to accept the patient if he is receiving IM injections. CSW updated [name] on Psychiatric consult and change to oral medications with the goal of maintaining baseline without IM injections.  [name] also stated she will need to reassess the patient once he has been w/out IM injections, sitter or restraints for 24 hours. Csw stated I would update Attending and ICU RN, as well as patient's spouse.  CSW spoke with patient's spouse, AJIT, ERRICO (Spouse)  [phone] [facility]) and updated her on conversation with [place] from [place].  CSW also stated [name] would like to reassess the patient, w/out [name] in the room, since they will need to assess how the patient behaves with them without her in the room.  Ms. Age verbalized understanding.  CSW stated Miriam recommended [name] contact [facility] tomorrow and schedule appointment for Friday 07/07/2021, to meet with [name] and [name] and to bring paperwork. Ms. Arch verbalized understanding.   Expected Discharge Plan: Assisted Living Barriers to Discharge: Continued Medical Work up  Expected Discharge Plan and Services Expected Discharge Plan: Assisted Living In-house Referral: Clinical Social Work   Post Acute Care Choice:  (ALF/Memory Care) Living arrangements for the past 2 months: Single Family Home                                       Social Determinants of Health (SDOH) Interventions    Readmission Risk Interventions No flowsheet data found.

## 2021-07-06 DIAGNOSIS — R410 Disorientation, unspecified: Secondary | ICD-10-CM | POA: Diagnosis not present

## 2021-07-06 DIAGNOSIS — F02818 Dementia in other diseases classified elsewhere, unspecified severity, with other behavioral disturbance: Secondary | ICD-10-CM | POA: Diagnosis not present

## 2021-07-06 DIAGNOSIS — G309 Alzheimer's disease, unspecified: Secondary | ICD-10-CM | POA: Diagnosis not present

## 2021-07-06 DIAGNOSIS — S0990XA Unspecified injury of head, initial encounter: Secondary | ICD-10-CM | POA: Diagnosis not present

## 2021-07-06 DIAGNOSIS — I6200 Nontraumatic subdural hemorrhage, unspecified: Secondary | ICD-10-CM | POA: Diagnosis not present

## 2021-07-06 DIAGNOSIS — N1832 Chronic kidney disease, stage 3b: Secondary | ICD-10-CM | POA: Diagnosis not present

## 2021-07-06 LAB — GLUCOSE, CAPILLARY
Glucose-Capillary: 159 mg/dL — ABNORMAL HIGH (ref 70–99)
Glucose-Capillary: 159 mg/dL — ABNORMAL HIGH (ref 70–99)
Glucose-Capillary: 168 mg/dL — ABNORMAL HIGH (ref 70–99)
Glucose-Capillary: 132 mg/dL — ABNORMAL HIGH (ref 70–99)

## 2021-07-06 LAB — GLUCOSE, RANDOM: Glucose, Bld: 163 mg/dL — ABNORMAL HIGH (ref 70–99)

## 2021-07-06 LAB — HEMOGLOBIN A1C
Hgb A1c MFr Bld: 7 % — ABNORMAL HIGH (ref 4.8–5.6)
Mean Plasma Glucose: 154 mg/dL

## 2021-07-06 MED ORDER — HALOPERIDOL LACTATE 5 MG/ML IJ SOLN: 2.0000 mg | Freq: Four times a day (QID) | INTRAMUSCULAR | Status: DC | PRN

## 2021-07-06 MED ORDER — RISPERIDONE 1 MG PO TBDP
1.0000 mg | ORAL_TABLET | Freq: Three times a day (TID) | ORAL | Status: DC
  Administered 2021-07-06 – 2021-07-09 (×8): 1 mg via ORAL
  Filled 2021-07-06 (×16): qty 1

## 2021-07-06 MED ORDER — ZIPRASIDONE MESYLATE 20 MG IM SOLR
20.0000 mg | Freq: Once | INTRAMUSCULAR | Status: AC
  Administered 2021-07-06: 20 mg via INTRAMUSCULAR
  Filled 2021-07-06: qty 20

## 2021-07-06 MED ORDER — HALOPERIDOL 2 MG PO TABS: 2.0000 mg | ORAL_TABLET | Freq: Four times a day (QID) | ORAL | Status: DC | PRN

## 2021-07-06 MED ORDER — HALOPERIDOL LACTATE 5 MG/ML IJ SOLN
2.0000 mg | Freq: Four times a day (QID) | INTRAMUSCULAR | Status: DC | PRN
  Administered 2021-07-06 – 2021-07-08 (×4): 2 mg via INTRAMUSCULAR
  Filled 2021-07-06 (×5): qty 1
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
## 2021-07-06 NOTE — Progress Notes (Signed)
Patient ID: Tanner Oconnell, male   DOB: 12/22/1945, 75 y.o.   MRN: [id number] when to check on patient. Wife and patient sister at bedside. Patient sister trying to feed magic cup. Patient had few spoonfuls of it. He is very agitated with mittens in his hands. Attempted to take mittens out as soon as we did patient pulled his Foley tubing away. Lose restraints on. Mittens placed back. Patient very agitated. Will give one dose of IM Geodon

## 2021-07-06 NOTE — TOC Progression Note (Signed)
Transition of Care [facility]) - Progression Note    Patient Details  Name: Tanner Oconnell MRN: [id number] Date of Birth: 1945/08/19  Transition of Care [facility]) CM/SW [place], [place] Phone Number: 07/06/2021, 4:41 PM  Clinical Narrative:     TOC spoke with patient's spouse Tanner Oconnell [phone].  Tanner Oconnell asked about transferring to [facility] and finding inpatient psychiatric care placement.  CSW reminded Tanner Oconnell patient did not meet in patient psychiatric criteria, and in his current mental status it would be unlikely for another hospital to recommend inpatient psychiatric admittance.  CSW also stated the patient would need an Attending doctor to be willing to accept the patient and she would need to discuss her desire for a transfer to another hospital with the attending at [facility]. CSW and Tanner Oconnell spoke about [facility] and how likely it would be for him to admit to [facility] due to his current mental status and the need for a 24/7 sitter and IM injections.  CSW informed Tanner Oconnell of different options for private care givers as a possible choice for the patient, if he is unable to go to [facility].  Tanner Oconnell stated they also has long-term care insurance, which paid a portion of his care, and she was interested in speaking with someone about private care givers.  CSW gave Tanner Oconnell the contact information for [facility], Imagene Sheller [phone].  Tanner Oconnell stated she would contact Tanner Oconnell.    Expected Discharge Plan: Assisted Living Barriers to Discharge: Continued Medical Work up  Expected Discharge Plan and Services Expected Discharge Plan: Assisted Living In-house Referral: Clinical Social Work   Post Acute Care Choice:  (ALF/Memory Care) Living arrangements for the past 2 months: Single Family Home                                       Social Determinants of Health (SDOH) Interventions    Readmission Risk Interventions No  flowsheet data found.

## 2021-07-06 DEATH — deceased

## 2021-07-07 DIAGNOSIS — S0990XA Unspecified injury of head, initial encounter: Secondary | ICD-10-CM | POA: Diagnosis not present

## 2021-07-07 DIAGNOSIS — R410 Disorientation, unspecified: Secondary | ICD-10-CM | POA: Diagnosis not present

## 2021-07-07 DIAGNOSIS — I6200 Nontraumatic subdural hemorrhage, unspecified: Secondary | ICD-10-CM | POA: Diagnosis not present

## 2021-07-07 DIAGNOSIS — G309 Alzheimer's disease, unspecified: Secondary | ICD-10-CM | POA: Diagnosis not present

## 2021-07-07 DIAGNOSIS — F02818 Dementia in other diseases classified elsewhere, unspecified severity, with other behavioral disturbance: Secondary | ICD-10-CM | POA: Diagnosis not present

## 2021-07-07 DIAGNOSIS — N1832 Chronic kidney disease, stage 3b: Secondary | ICD-10-CM | POA: Diagnosis not present

## 2021-07-07 LAB — BASIC METABOLIC PANEL
Anion gap: 10 (ref 5–15)
BUN: 31 mg/dL — ABNORMAL HIGH (ref 8–23)
CO2: 26 mmol/L (ref 22–32)
Calcium: 9.5 mg/dL (ref 8.9–10.3)
Chloride: 105 mmol/L (ref 98–111)
Creatinine, Ser: 1.17 mg/dL (ref 0.61–1.24)
GFR, Estimated: >60 mL/min (ref >60)
Glucose, Bld: 164 mg/dL — ABNORMAL HIGH (ref 70–99)
Potassium: 3.7 mmol/L (ref 3.5–5.1)
Sodium: 141 mmol/L (ref 135–145)

## 2021-07-07 LAB — GLUCOSE, CAPILLARY
Glucose-Capillary: 154 mg/dL — ABNORMAL HIGH (ref 70–99)
Glucose-Capillary: 160 mg/dL — ABNORMAL HIGH (ref 70–99)
Glucose-Capillary: 160 mg/dL — ABNORMAL HIGH (ref 70–99)
Glucose-Capillary: 158 mg/dL — ABNORMAL HIGH (ref 70–99)

## 2021-07-07 LAB — GLUCOSE, RANDOM: Glucose, Bld: 164 mg/dL — ABNORMAL HIGH (ref 70–99)

## 2021-07-07 MED ORDER — DEXMEDETOMIDINE HCL IN NACL 400 MCG/100ML IV SOLN
0.4000 ug/kg/h | INTRAVENOUS | Status: DC
  Administered 2021-07-07: 0.4 ug/kg/h via INTRAVENOUS
  Administered 2021-07-07: 0.5 ug/kg/h via INTRAVENOUS
  Administered 2021-07-08: 1 ug/kg/h via INTRAVENOUS
  Administered 2021-07-09: 14:00:00 0.4 ug/kg/h via INTRAVENOUS
  Filled 2021-07-07 (×4): qty 100

## 2021-07-07 MED ORDER — DIAZEPAM 5 MG/ML IJ SOLN
5.0000 mg | Freq: Four times a day (QID) | INTRAMUSCULAR | Status: DC | PRN
  Administered 2021-07-07: 5 mg via INTRAVENOUS
  Filled 2021-07-07: qty 2
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
## 2021-07-07 NOTE — Progress Notes (Signed)
STAT BMP placed by Dr. Posey Pronto. Pt uncooperative and will not allow phlebotomist to draw blood. Will attempt later in shift.

## 2021-07-07 NOTE — Progress Notes (Signed)
Pt is agitated this morning, trying to get out of bed. Haldol administered X2 on shift. Mittens and external cathter in place, pt needs frequent reorientation. A Nurse assistant/ sitter that can help with pt needs is preferred at bed side.
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
## 2021-07-08 LAB — AMMONIA: Ammonia: 30 umol/L (ref 9–35)

## 2021-07-08 LAB — GLUCOSE, CAPILLARY
Glucose-Capillary: 142 mg/dL — ABNORMAL HIGH (ref 70–99)
Glucose-Capillary: 129 mg/dL — ABNORMAL HIGH (ref 70–99)
Glucose-Capillary: 136 mg/dL — ABNORMAL HIGH (ref 70–99)

## 2021-07-08 LAB — GLUCOSE, RANDOM: Glucose, Bld: 166 mg/dL — ABNORMAL HIGH (ref 70–99)

## 2021-07-08 MED ORDER — DIAZEPAM 5 MG/ML IJ SOLN
5.0000 mg | Freq: Four times a day (QID) | INTRAMUSCULAR | Status: DC | PRN
  Administered 2021-07-08 – 2021-07-09 (×2): 5 mg via INTRAVENOUS
  Filled 2021-07-08 (×2): qty 2
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
## 2021-07-08 NOTE — Progress Notes (Signed)
Chaplain Maggie made initial visit at bedside with pt and family. Empathetic listening, compassionate presence, and hospitality were central to this visit. Family coming to terms with what it means for patient to enter hospice care. Support of family obvious as wife and older sister are at together at pt's side. Continued support available per on call chaplain.
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
## 2021-07-09 ENCOUNTER — Inpatient Hospital Stay: Payer: Medicare HMO

## 2021-07-09 DIAGNOSIS — M2602 Maxillary hypoplasia: Secondary | ICD-10-CM | POA: Diagnosis not present

## 2021-07-09 DIAGNOSIS — R410 Disorientation, unspecified: Secondary | ICD-10-CM | POA: Diagnosis not present

## 2021-07-09 LAB — MAGNESIUM
Magnesium: 2 mg/dL (ref 1.7–2.4)
Magnesium: 2 mg/dL (ref 1.7–2.4)

## 2021-07-09 LAB — GLUCOSE, CAPILLARY
Glucose-Capillary: 152 mg/dL — ABNORMAL HIGH (ref 70–99)
Glucose-Capillary: 186 mg/dL — ABNORMAL HIGH (ref 70–99)
Glucose-Capillary: 139 mg/dL — ABNORMAL HIGH (ref 70–99)
Glucose-Capillary: 158 mg/dL — ABNORMAL HIGH (ref 70–99)

## 2021-07-09 LAB — BASIC METABOLIC PANEL
Anion gap: 10 (ref 5–15)
BUN: 45 mg/dL — ABNORMAL HIGH (ref 8–23)
CO2: 26 mmol/L (ref 22–32)
Calcium: 9.3 mg/dL (ref 8.9–10.3)
Chloride: 105 mmol/L (ref 98–111)
Creatinine, Ser: 1.26 mg/dL — ABNORMAL HIGH (ref 0.61–1.24)
GFR, Estimated: 59 mL/min — ABNORMAL LOW (ref >60)
Glucose, Bld: 167 mg/dL — ABNORMAL HIGH (ref 70–99)
Potassium: 3.5 mmol/L (ref 3.5–5.1)
Sodium: 141 mmol/L (ref 135–145)

## 2021-07-09 MED ORDER — HALOPERIDOL LACTATE 5 MG/ML IJ SOLN
2.0000 mg | Freq: Four times a day (QID) | INTRAMUSCULAR | Status: DC | PRN
  Administered 2021-07-09 – 2021-07-10 (×3): 2 mg via INTRAVENOUS
  Filled 2021-07-09 (×3): qty 1

## 2021-07-09 MED ORDER — SODIUM CHLORIDE 0.9 % IV SOLN: INTRAVENOUS | Status: DC

## 2021-07-09 MED ORDER — DIAZEPAM 5 MG/ML IJ SOLN
5.0000 mg | Freq: Once | INTRAMUSCULAR | Status: AC
  Administered 2021-07-09: 13:00:00 5 mg via INTRAVENOUS
  Filled 2021-07-09: qty 2

## 2021-07-09 MED ORDER — POTASSIUM CHLORIDE 10 MEQ/100ML IV SOLN
10.0000 meq | INTRAVENOUS | Status: AC
  Administered 2021-07-09 (×4): 10 meq via INTRAVENOUS
  Filled 2021-07-09 (×4): qty 100

## 2021-07-09 NOTE — Consult Note (Signed)
PHARMACY CONSULT NOTE - FOLLOW UP  Pharmacy Consult for Electrolyte Monitoring and Replacement   Recent Labs: Potassium (mmol/L)  Date Value  07/09/2021 3.5   Magnesium (mg/dL)  Date Value  07/09/2021 2.0   Calcium (mg/dL)  Date Value  07/09/2021 9.3   Albumin (g/dL)  Date Value  07/04/2021 4.6   Sodium (mmol/L)  Date Value  07/09/2021 141     Assessment: Patient admitted with subdural hemorrhage after fall. PMH includes dementia d/t advanced Alzheimer's disease, HTN, Afib, and diabetes. Noted electrolytes disturbance as well. Pharmacy consulted for electrolytes replacement.  Noted patient unable to tolerate po potassium. Consult is for IV replacement only.  Calcium, magnesium, and sodium are within normal limits. Potassium improved but below desired level of 4.0.  Goal of Therapy:  Electrolytes WNL  Plan:  Replace potassium at 62meq for each 0.40meq/L to goal per protocol Will order KCL 37meq q1 hr x4 Repeat K level with AM labs Pharmacy will continue to monitor and replace as needed  Elanna Bert Rodriguez-Guzman PharmD, BCPS 07/09/2021 7:27 PM
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
## 2021-07-09 NOTE — Consult Note (Signed)
[facility] Psychiatry Consult   Reason for Consult: Follow-up patient with dementia currently delirious and still in the ICU Referring Physician: Posey Pronto Patient Identification: Tanner Oconnell MRN:  [id number] Principal Diagnosis: Acute delirium Diagnosis:  Principal Problem:   Acute delirium Active Problems:   Stroke (Tanner Oconnell)   Dementia (Tanner Oconnell)   Type II diabetes mellitus with renal manifestations ([place])   Hypothyroidism   Hypertension   New onset atrial fibrillation ([place])   CKD (chronic kidney disease), stage IIIa   Sleep apnea   Subdural hemorrhage ([place])   Head injury   Total Time spent with patient: 30 minutes  Subjective:   Tanner Oconnell is a 75 y.o. male patient admitted with patient not able to give information.  HPI: Patient seen.  Spoke with his family including son and granddaughter as well as wife.  Patient continues to be agitated and confused.  Attempt at using Precedex was foiled by bradycardia previously.  Patient most recently had been on Valium as needed.  Head CT was repeated today and showed no new problem but an improvement with resolution of the subdural hematoma.  Patient seen could not really communicate with him.  He did ask me if I could get him a drink but otherwise was still confused.  Family reports that he still has moments of trying to get up out of bed.  He has been placed back on Precedex at least for the moment for safety.  Past Psychiatric History: Past history of dementia but no previous identified psychiatric illness  Risk to Self:   Risk to Others:   Prior Inpatient Therapy:   Prior Outpatient Therapy:    Past Medical History:  Past Medical History:  Diagnosis Date   Dementia ([place])    Diabetes mellitus without complication ([place])    History of kidney stones    Hyperlipidemia    Hypertension    Hypothyroidism    Obstructive sleep apnea    refuses to wear cpap   Thrombocytopenia ([place])    Thyroid disease    Hypothyroidism    Past  Surgical History:  Procedure Laterality Date   BONE MARROW BIOPSY     COLONOSCOPY     COLONOSCOPY WITH PROPOFOL N/A 02/24/2020   Procedure: COLONOSCOPY WITH PROPOFOL;  Surgeon: Robert Bellow, MD;  Location: [facility] ENDOSCOPY;  Service: Gastroenterology;  Laterality: N/A;   ESOPHAGOGASTRODUODENOSCOPY N/A 02/24/2020   Procedure: ESOPHAGOGASTRODUODENOSCOPY (EGD);  Surgeon: Robert Bellow, MD;  Location: [facility] ENDOSCOPY;  Service: Gastroenterology;  Laterality: N/A;   Family History:  Family History  Problem Relation Age of Onset   Cancer Mother    Cancer Father    Family Psychiatric  History: See previous Social History:  Social History   Substance and Sexual Activity  Alcohol Use Never     Social History   Substance and Sexual Activity  Drug Use Never    Social History   Socioeconomic History   Marital status: Married    Spouse name: Not on file   Number of children: Not on file   Years of education: Not on file   Highest education level: Not on file  Occupational History   Not on file  Tobacco Use   Smoking status: Never   Smokeless tobacco: Former    Types: Chew    Quit date: 10/05/2009  Vaping Use   Vaping Use: Never used  Substance and Sexual Activity   Alcohol use: Never   Drug use: Never   Sexual activity: Not on  file  Other Topics Concern   Not on file  Social History Narrative   Not on file   Social Determinants of Health   Financial Resource Strain: Not on file  Food Insecurity: Not on file  Transportation Needs: Not on file  Physical Activity: Not on file  Stress: Not on file  Social Connections: Not on file   Additional Social History:    Allergies:   Allergies  Allergen Reactions   Penicillins Rash    Unknown reaction Red bumps.    Labs:  Results for orders placed or performed during the hospital encounter of 06/12/2021 (from the past 48 hour(s))  Glucose, capillary     Status: Abnormal   Collection Time: 07/07/21  3:59 PM  Result  Value Ref Range   Glucose-Capillary 160 (H) 70 - 99 mg/dL    Comment: Glucose reference range applies only to samples taken after fasting for at least 8 hours.  Glucose, capillary     Status: Abnormal   Collection Time: 07/07/21  9:57 PM  Result Value Ref Range   Glucose-Capillary 158 (H) 70 - 99 mg/dL    Comment: Glucose reference range applies only to samples taken after fasting for at least 8 hours.  Glucose, random     Status: Abnormal   Collection Time: 07/08/21  7:09 AM  Result Value Ref Range   Glucose, Bld 166 (H) 70 - 99 mg/dL    Comment: Glucose reference range applies only to samples taken after fasting for at least 8 hours. Performed at [facility], [address] [place], [place] [address]   Glucose, capillary     Status: Abnormal   Collection Time: 07/08/21  9:08 AM  Result Value Ref Range   Glucose-Capillary 142 (H) 70 - 99 mg/dL    Comment: Glucose reference range applies only to samples taken after fasting for at least 8 hours.  Glucose, capillary     Status: Abnormal   Collection Time: 07/08/21 11:23 AM  Result Value Ref Range   Glucose-Capillary 129 (H) 70 - 99 mg/dL    Comment: Glucose reference range applies only to samples taken after fasting for at least 8 hours.  Ammonia     Status: None   Collection Time: 07/08/21 11:29 AM  Result Value Ref Range   Ammonia 30 9 - 35 umol/L    Comment: Performed at [facility], [address] [place], [place] [address]  Glucose, capillary     Status: Abnormal   Collection Time: 07/08/21  3:57 PM  Result Value Ref Range   Glucose-Capillary 136 (H) 70 - 99 mg/dL    Comment: Glucose reference range applies only to samples taken after fasting for at least 8 hours.  Basic metabolic panel     Status: Abnormal   Collection Time: 07/09/21  2:12 AM  Result Value Ref Range   Sodium 141 135 - 145 mmol/L   Potassium 3.5 3.5 - 5.1 mmol/L   Chloride 105 98 - 111 mmol/L   CO2 26 22 - 32 mmol/L   Glucose, Bld  167 (H) 70 - 99 mg/dL    Comment: Glucose reference range applies only to samples taken after fasting for at least 8 hours.   BUN 45 (H) 8 - 23 mg/dL   Creatinine, Ser 1.26 (H) 0.61 - 1.24 mg/dL   Calcium 9.3 8.9 - 10.3 mg/dL   GFR, Estimated 59 (L) >60 mL/min    Comment: (NOTE) Calculated using the CKD-EPI Creatinine Equation (2021)  Anion gap 10 5 - 15    Comment: Performed at [facility], [address] [place], [place] [address]  Magnesium     Status: None   Collection Time: 07/09/21  2:12 AM  Result Value Ref Range   Magnesium 2.0 1.7 - 2.4 mg/dL    Comment: Performed at [facility], [address] [place], [place] [address]  Glucose, capillary     Status: Abnormal   Collection Time: 07/09/21  7:23 AM  Result Value Ref Range   Glucose-Capillary 152 (H) 70 - 99 mg/dL    Comment: Glucose reference range applies only to samples taken after fasting for at least 8 hours.  Glucose, capillary     Status: Abnormal   Collection Time: 07/09/21 11:31 AM  Result Value Ref Range   Glucose-Capillary 186 (H) 70 - 99 mg/dL    Comment: Glucose reference range applies only to samples taken after fasting for at least 8 hours.    Current Facility-Administered Medications  Medication Dose Route Frequency Provider Last Rate Last Admin   0.9 %  sodium chloride infusion  250 mL Intravenous PRN Wouk, Ailene Rud, MD       0.9 %  sodium chloride infusion   Intravenous Continuous Fritzi Mandes, MD 75 mL/hr at 07/09/21 1216 New Bag at 07/09/21 1216   amLODipine (NORVASC) tablet 5 mg  5 mg Oral Daily Gwynne Edinger, MD   5 mg at 07/09/21 0911   atorvastatin (LIPITOR) tablet 40 mg  40 mg Oral Daily Gwynne Edinger, MD   40 mg at 07/09/21 0911   Chlorhexidine Gluconate Cloth 2 % PADS 6 each  6 each Topical Q0600 Sharion Settler, NP   6 each at 07/09/21 0321   cloNIDine (CATAPRES - Dosed in mg/24 hr) patch 0.1 mg  0.1 mg Transdermal Weekly Fritzi Mandes, MD   0.1 mg at 07/05/21  2019   feeding supplement (ENSURE ENLIVE / ENSURE PLUS) liquid 237 mL  237 mL Oral TID BM Fritzi Mandes, MD   237 mL at 07/08/21 2254   haloperidol lactate (HALDOL) injection 2 mg  2 mg Intravenous Q6H PRN Abhishek Levesque T, MD   2 mg at 07/09/21 1244   insulin aspart (novoLOG) injection 0-5 Units  0-5 Units Subcutaneous QHS Fritzi Mandes, MD       insulin aspart (novoLOG) injection 0-9 Units  0-9 Units Subcutaneous TID WC Fritzi Mandes, MD   2 Units at 07/09/21 1213   levothyroxine (SYNTHROID) tablet 25 mcg  25 mcg Oral Daily Gwynne Edinger, MD   25 mcg at 07/09/21 0556   memantine (NAMENDA) tablet 10 mg  10 mg Oral BID Fritzi Mandes, MD   10 mg at 07/09/21 2248   multivitamin with minerals tablet 1 tablet  1 tablet Oral Daily Fritzi Mandes, MD   1 tablet at 07/09/21 0906   pantoprazole (PROTONIX) EC tablet 40 mg  40 mg Oral Daily Gwynne Edinger, MD   40 mg at 07/09/21 0912   propranolol (INDERAL) tablet 10 mg  10 mg Oral BID Gwynne Edinger, MD   10 mg at 07/09/21 2500   QUEtiapine (SEROQUEL) tablet 25 mg  25 mg Oral QHS Gwynne Edinger, MD   25 mg at 07/08/21 2254   risperiDONE (RISPERDAL M-TABS) disintegrating tablet 1 mg  1 mg Oral TID Ike Maragh, Madie Reno, MD   1 mg at 07/09/21 0906   sertraline (ZOLOFT) tablet 100 mg  100 mg Oral Daily Posey Pronto,  Sona, MD   100 mg at 07/09/21 0906   sodium chloride flush (NS) 0.9 % injection 3 mL  3 mL Intravenous Q12H Wouk, Ailene Rud, MD   3 mL at 07/09/21 0912   sodium chloride flush (NS) 0.9 % injection 3 mL  3 mL Intravenous PRN Wouk, Ailene Rud, MD        Musculoskeletal: Strength & Muscle Tone: within normal limits Gait & Station: unable to stand Patient leans: N/A            Psychiatric Specialty Exam:  Presentation  General Appearance: No data recorded Eye Contact:No data recorded Speech:No data recorded Speech Volume:No data recorded Handedness:No data recorded  Mood and Affect  Mood:No data recorded Affect:No data  recorded  Thought Process  Thought Processes:No data recorded Descriptions of Associations:No data recorded Orientation:No data recorded Thought Content:No data recorded History of Schizophrenia/Schizoaffective disorder:No data recorded Duration of Psychotic Symptoms:No data recorded Hallucinations:No data recorded Ideas of Reference:No data recorded Suicidal Thoughts:No data recorded Homicidal Thoughts:No data recorded  Sensorium  Memory:No data recorded Judgment:No data recorded Insight:No data recorded  Executive Functions  Concentration:No data recorded Attention Span:No data recorded Recall:No data recorded Fund of Knowledge:No data recorded Language:No data recorded  Psychomotor Activity  Psychomotor Activity:No data recorded  Assets  Assets:No data recorded  Sleep  Sleep:No data recorded  Physical Exam: Physical Exam Vitals and nursing note reviewed.  HENT:     Head: Normocephalic and atraumatic.     Mouth/Throat:     Pharynx: Oropharynx is clear.  Eyes:     Pupils: Pupils are equal, round, and reactive to light.  Cardiovascular:     Rate and Rhythm: Normal rate and regular rhythm.  Pulmonary:     Effort: Pulmonary effort is normal.     Breath sounds: Normal breath sounds.  Abdominal:     General: Abdomen is flat.     Palpations: Abdomen is soft.  Musculoskeletal:        General: Normal range of motion.  Skin:    General: Skin is warm and dry.  Neurological:     General: No focal deficit present.     Mental Status: Mental status is at baseline.  Psychiatric:        Attention and Perception: He is inattentive.        Mood and Affect: Mood normal. Affect is blunt.        Speech: He is noncommunicative.        Behavior: Behavior is agitated. Behavior is not aggressive.        Cognition and Memory: Cognition normal. Memory is impaired.   Review of Systems  Unable to perform ROS: Mental status change  Blood pressure (!) 150/94, pulse 92,  temperature 99 F (37.2 C), temperature source Oral, resp. rate (!) 33, height 6' (1.829 m), weight 105.2 kg, SpO2 100 %. Body mass index is 31.46 kg/m.  Treatment Plan Summary: Medication management and Plan reviewed medication.  Would recommend antipsychotics instead of Valium for control of agitation.  Discontinued Valium and ordered Haldol IV instead.  Understand the obvious primary need for safety.  Reviewed all of that with the family.  Reviewed with him the paradox that the ICU itself can make people more delirious but that until the agitation is controlled it is unsafe often to move them out of it.  I will continue to follow up with the patient.  Expressed to the wife that I still have optimism that gradually this is going to  improve as there is no specific new lesion such as a new stroke.  Disposition: Supportive therapy provided about ongoing stressors.  Alethia Berthold, MD 07/09/2021 3:21 PM

## 2021-08-06 NOTE — Progress Notes (Signed)
Patient agitated and squirming around in the bed. Sitter was relieved for a break. This writer released 2 restraints, right arm and right foot, to perform ROM exercises. Patient was alert and talking although slurred and almost incomprehensible as it had been over the last couple of days. Bp was elevated and the hospitalist was secure chatted when the O2 sat began alarming in the 80s. Non-rebreather was applied at 15L/M. O2 sat steadily decreased and the heart rate began to follow dropping from the 100s and the patient became unresponsive at this time. This was added to the secure chat to the physician and he was then also paged. The monitor began to alarm asystole and no chest rise and fall were noted. Patient was a DNR. CPR not attempted. Time of death called at 0203.

## 2021-08-06 NOTE — Accreditation Note (Signed)
Restraints reported to CMS  Pursuant to regulation 482.13 (G) (3) use of restraints was logged and CMS was notified via email on (2021/07/12 at 1210.

## 2021-08-06 NOTE — Progress Notes (Signed)
Hospitalist notified family. Family was advised by this Probation officer where the patient would be when they arrived.

## 2021-08-06 NOTE — Progress Notes (Signed)
Talked with ME Izora Ribas, about falls coming into hospital, subsequent scans, and having restraints on prior to death. Reviewed record. Lanny Hurst said patient is not an ME case.
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
## 2021-08-06 DEATH — deceased

## 2021-08-29 IMAGING — MR MR HEAD W/O CM
8 of 10 series · 35 of 48 positions shown · non-contrast
Comparison: CT head 02/02/2021

CLINICAL DATA: TIA.  Slurred speech

EXAM:
MRI HEAD WITHOUT CONTRAST
MRA HEAD WITHOUT CONTRAST
TECHNIQUE: Multiplanar, multi-echo pulse sequences of the brain and surrounding
structures were acquired without intravenous contrast. Angiographic
images of the Circle of Willis were acquired using MRA technique
without intravenous contrast.

[Series 2: ax dwi_tracew · axial · 3.0mm · 0.71mm/px · z∈[-112,+53]mm · 7 of 56 slices shown]
[im 1/56]
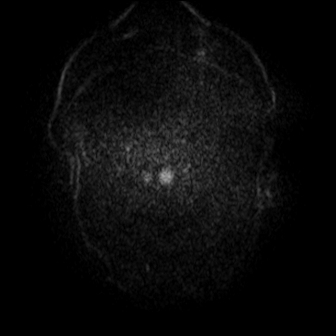
[im 10/56]
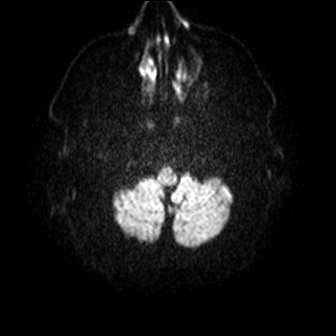
[im 19/56]
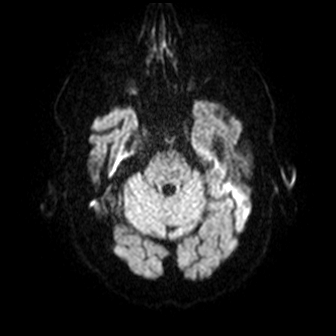
[im 28/56]
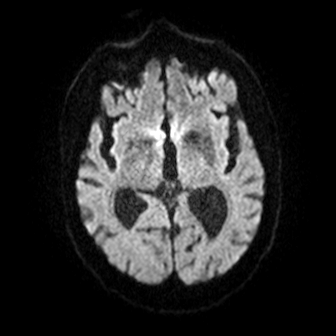
[im 37/56]
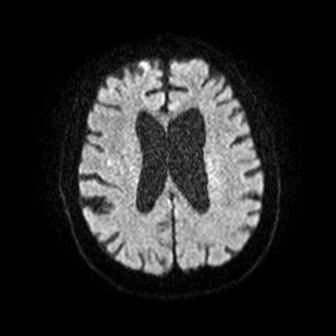
[im 46/56]
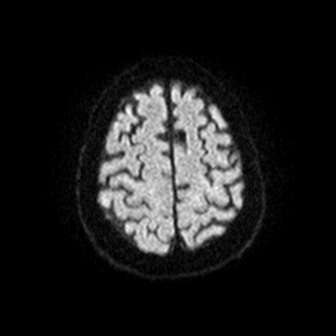
[im 56/56]
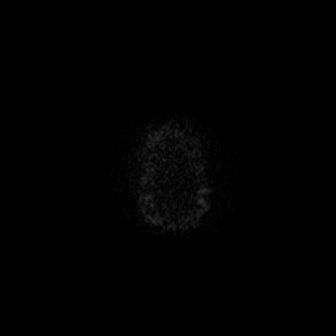

[Series 3: ax dwi_adc · axial · 3.0mm · 0.71mm/px · z∈[-112,+53]mm · 7 of 56 slices shown]
[im 1/56]
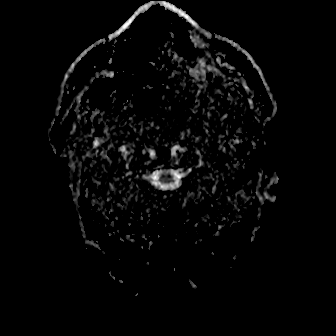
[im 10/56]
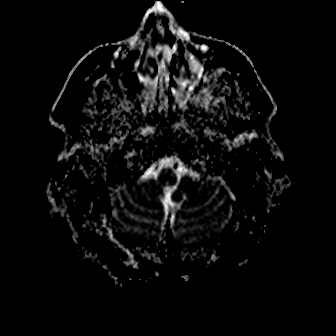
[im 19/56]
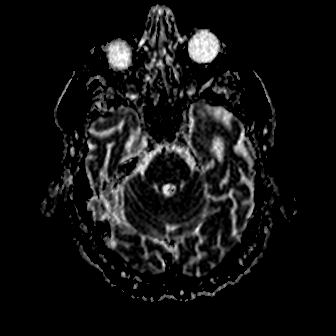
[im 28/56]
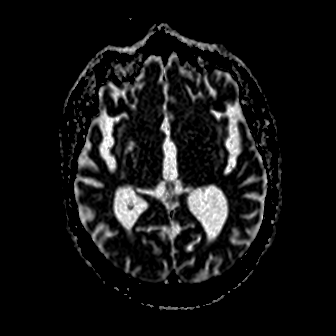
[im 37/56]
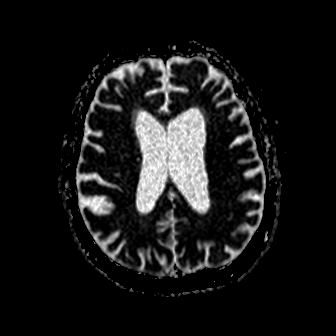
[im 46/56]
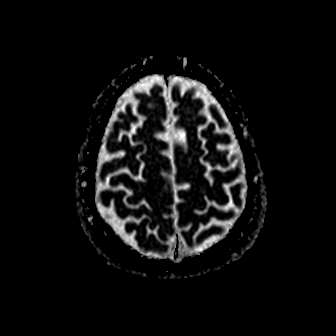
[im 56/56]
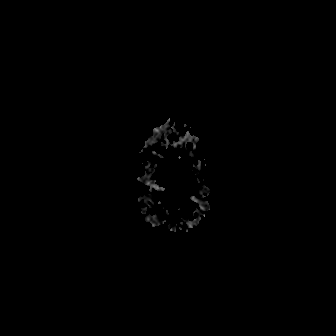

[Series 4: cor dwi_tracew · coronal · 5.0mm · 0.68mm/px · 1 of 40 slices shown]
[im 1/40]
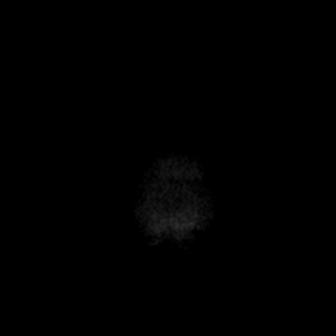

[Series 6: T1 · sagittal · 5.0mm · 0.47mm/px · 3 of 24 slices shown (1 of 2)]
[im 1/24]
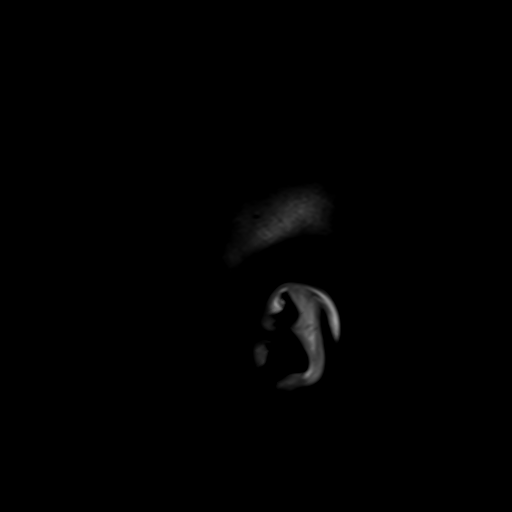
[im 12/24]
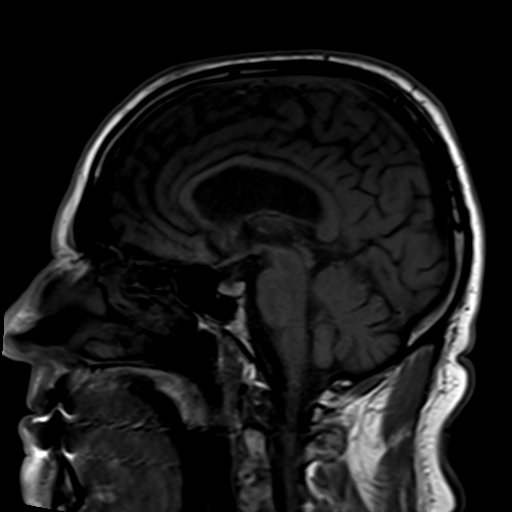
[im 24/24]
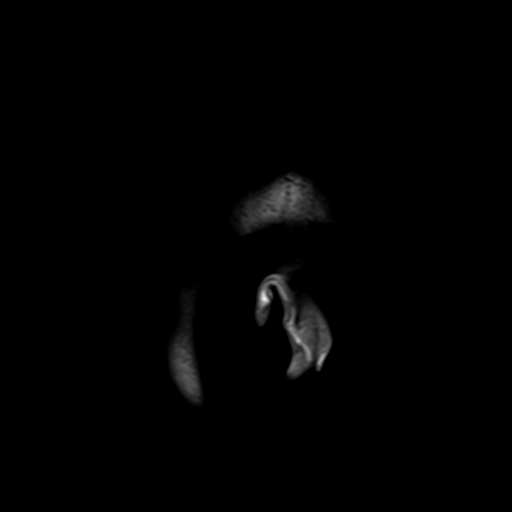

[Series 7: T2 · axial · 5.0mm · 0.45mm/px · z∈[-110,+52]mm · 4 of 28 slices shown (1 of 2)]
[im 1/28]
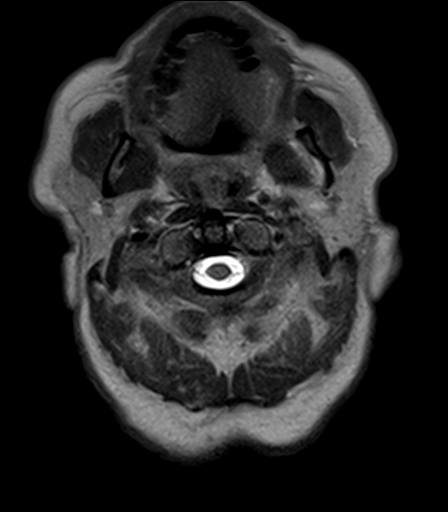
[im 10/28]
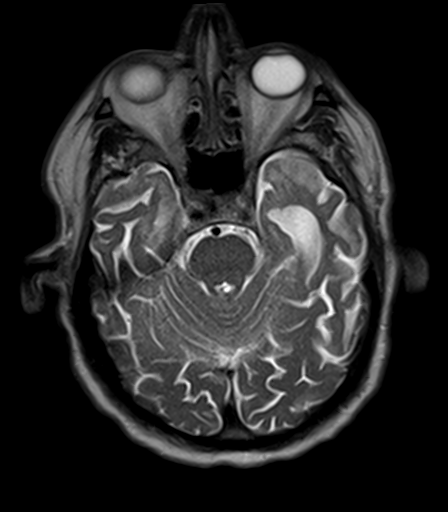
[im 19/28]
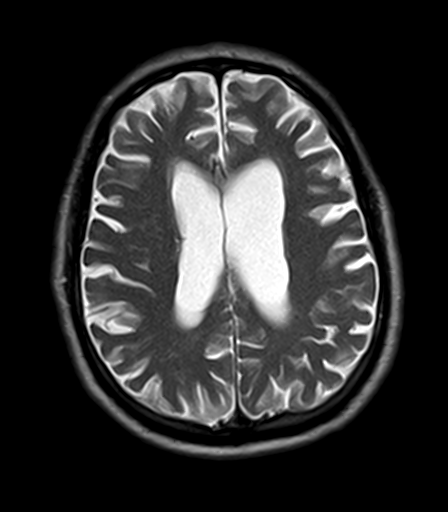
[im 28/28]
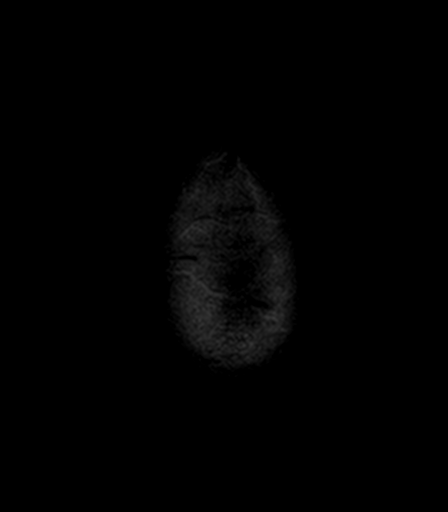

[Series 9: FLAIR · axial · 5.0mm · 1.20mm/px · z∈[-110,+52]mm · 4 of 28 slices shown]
[im 1/28]
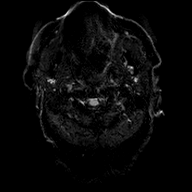
[im 10/28]
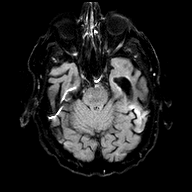
[im 19/28]
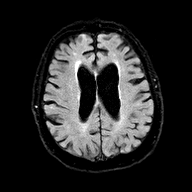
[im 28/28]
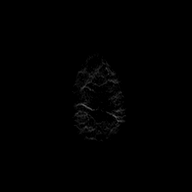

[Series 10: T1 · axial · 5.0mm · 0.90mm/px · z∈[-110,+52]mm · 4 of 28 slices shown (2 of 2)]
[im 1/28]
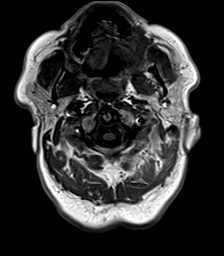
[im 10/28]
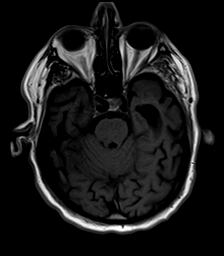
[im 19/28]
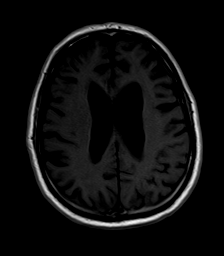
[im 28/28]
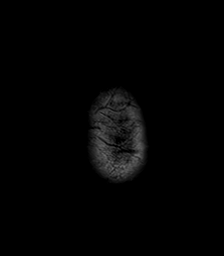

[Series 11: T2 · coronal · 5.0mm · 0.45mm/px · 5 of 34 slices shown (2 of 2)]
[im 1/34]
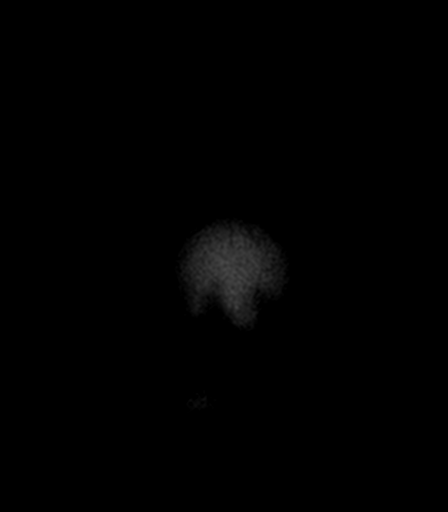
[im 9/34]
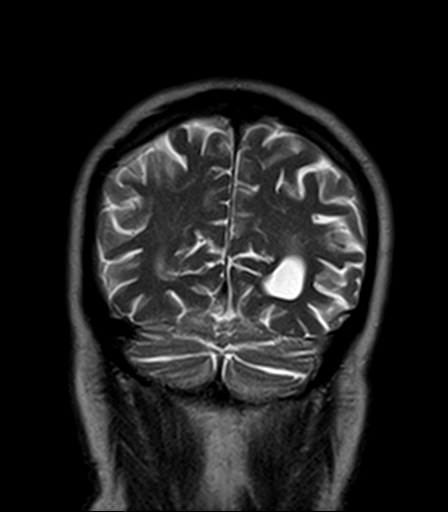
[im 17/34]
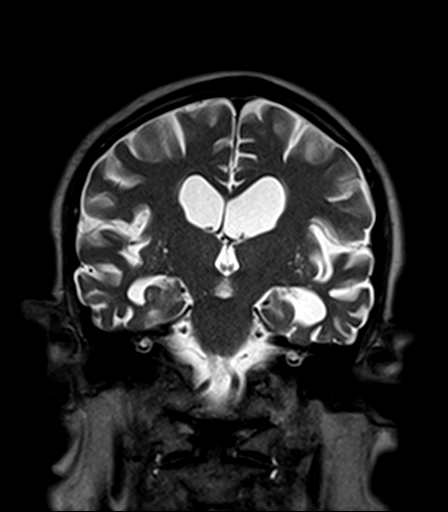
[im 25/34]
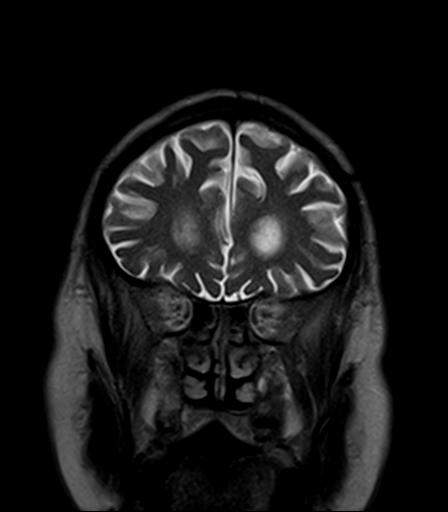
[im 34/34]
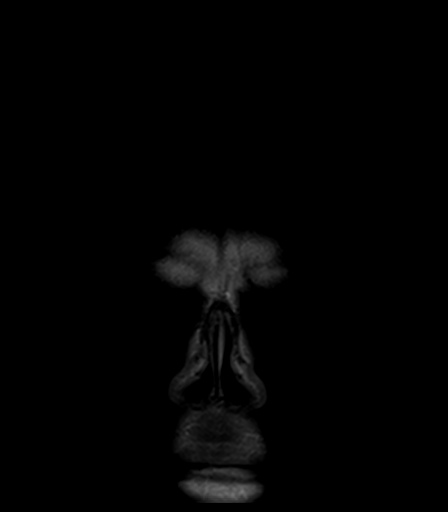

[35 of 48 positions shown; findings below may reference images not displayed]

FINDINGS: MRI HEAD FINDINGS

Brain: 3 mm diffusion positivity in the right frontal operculum
compatible with acute infarct. No other areas of restricted
diffusion

Generalized atrophy without hydrocephalus. Mild white matter changes
consistent with chronic microvascular ischemia. Negative for
hemorrhage or mass.

Vascular: Normal arterial flow voids.

Skull and upper cervical spine: Negative

Sinuses/Orbits: With mucosal edema left contraction maxillary.
Bilateral sinus cataract extraction

Other: None

MRA HEAD FINDINGS

Anterior circulation: Internal carotid artery widely patent
bilaterally. Hypoplastic left A1 segment. Both anterior cerebral
arteries are patent supplied primarily from the right. Middle
cerebral arteries patent bilaterally. Severe focal stenosis in
posterior right M2 branch. Moderate stenosis left anterior M2
branch. No vascular malformation or large vessel occlusion.

Posterior circulation: Left vertebral artery dominant. Both
vertebral arteries patent to the basilar. PICA not included on the
study. AICA patent bilaterally. Basilar widely patent. Superior
cerebellar and posterior cerebral arteries patent bilaterally. Mild
stenosis in the left P2 segment. No vascular malformation

Anatomic variants: None
IMPRESSION: Small 3 mm area of acute infarct in the right frontal operculum.

Atrophy and mild chronic microvascular ischemic change

Severe focal stenosis posterior right M2 branch. Moderate stenosis
left anterior M2 branch. Mild stenosis left P2 branch.
